# Patient Record
Sex: Female | Born: 1945 | Race: White | Hispanic: No | Marital: Married | Smoking: Former smoker
Health system: Southern US, Community
[De-identification: ages and names within clinical notes are randomized; demographics above are authoritative.]

## PROBLEM LIST (undated history)

## (undated) DIAGNOSIS — F418 Other specified anxiety disorders: Secondary | ICD-10-CM

## (undated) DIAGNOSIS — R87629 Unspecified abnormal cytological findings in specimens from vagina: Secondary | ICD-10-CM

## (undated) DIAGNOSIS — Z9889 Other specified postprocedural states: Secondary | ICD-10-CM

## (undated) DIAGNOSIS — Z85820 Personal history of malignant melanoma of skin: Secondary | ICD-10-CM

## (undated) HISTORY — PX: BASAL CELL CARCINOMA EXCISION: SHX1214

## (undated) HISTORY — DX: Unspecified abnormal cytological findings in specimens from vagina: R87.629

---

## 2002-06-28 HISTORY — PX: MELANOMA EXCISION: SHX5266

## 2002-12-27 DIAGNOSIS — C439 Malignant melanoma of skin, unspecified: Secondary | ICD-10-CM | POA: Insufficient documentation

## 2010-09-27 DIAGNOSIS — C4491 Basal cell carcinoma of skin, unspecified: Secondary | ICD-10-CM | POA: Insufficient documentation

## 2012-11-21 ENCOUNTER — Encounter: Payer: Self-pay | Admitting: *Deleted

## 2012-11-21 ENCOUNTER — Emergency Department (INDEPENDENT_AMBULATORY_CARE_PROVIDER_SITE_OTHER)
Admission: EM | Admit: 2012-11-21 | Discharge: 2012-11-21 | Disposition: A | Discharge status: Home / Self Care | Payer: No Typology Code available for payment source | Source: Home / Self Care | Attending: Family Medicine | Admitting: Family Medicine

## 2012-11-21 ENCOUNTER — Emergency Department (INDEPENDENT_AMBULATORY_CARE_PROVIDER_SITE_OTHER): Payer: No Typology Code available for payment source

## 2012-11-21 DIAGNOSIS — R1031 Right lower quadrant pain: Secondary | ICD-10-CM

## 2012-11-21 DIAGNOSIS — K449 Diaphragmatic hernia without obstruction or gangrene: Secondary | ICD-10-CM

## 2012-11-21 DIAGNOSIS — R1013 Epigastric pain: Secondary | ICD-10-CM

## 2012-11-21 DIAGNOSIS — R918 Other nonspecific abnormal finding of lung field: Secondary | ICD-10-CM

## 2012-11-21 DIAGNOSIS — I7090 Unspecified atherosclerosis: Secondary | ICD-10-CM

## 2012-11-21 DIAGNOSIS — R109 Unspecified abdominal pain: Secondary | ICD-10-CM

## 2012-11-21 HISTORY — DX: Other specified anxiety disorders: F41.8

## 2012-11-21 HISTORY — DX: Personal history of malignant melanoma of skin: Z98.890

## 2012-11-21 HISTORY — DX: Other specified postprocedural states: Z85.820

## 2012-11-21 LAB — COMPREHENSIVE METABOLIC PANEL
Albumin: 4.4 g/dL (ref 3.5–5.2)
BUN: 7 mg/dL (ref 6–23)
CO2: 27 mEq/L (ref 19–32)
Calcium: 9.2 mg/dL (ref 8.4–10.5)
Chloride: 104 mEq/L (ref 96–112)
Glucose, Bld: 90 mg/dL (ref 70–99)
Potassium: 4 mEq/L (ref 3.5–5.3)

## 2012-11-21 LAB — POCT CBC W AUTO DIFF (K'VILLE URGENT CARE)

## 2012-11-21 MED ORDER — IOHEXOL 300 MG/ML  SOLN
100.0000 mL | Freq: Once | INTRAMUSCULAR | Status: AC | PRN
  Administered 2012-11-21: 100 mL via INTRAVENOUS

## 2012-11-21 MED ORDER — PANTOPRAZOLE SODIUM 40 MG PO TBEC: 40.0000 mg | DELAYED_RELEASE_TABLET | Freq: Every day | ORAL | Status: DC

## 2012-11-21 MED ORDER — GI COCKTAIL ~~LOC~~
30.0000 mL | Freq: Once | ORAL | Status: AC
  Administered 2012-11-21: 30 mL via ORAL

## 2012-11-21 NOTE — ED Provider Notes (Addendum)
History     CSN: [id number]  Arrival date & time 11/21/12  1221   First MD Initiated Contact with Patient 11/21/12 1235      Chief Complaint  Patient presents with  . Heartburn  . Anorexia   HPI This is a 67 year old otherwise healthy female who presents today with abdominal pain, indigestion, decreased appetite. Patient states symptoms have been present over the past one and half weeks. Patient states she ate some asparagus and within 24 hours eating she developed some diarrhea and nausea. Patient states his symptoms resolved within about 2-3 days. Since that point, patient states she's had persistent indigestion symptoms regardless of her diet. Patient denies any prior history of indigestion or other GI issues. Patient denies any recent NSAID or steroid use. No known trauma. Patient denies any systemic fevers or chills. Abdominal pain has been fairly diffuse in nature without radiation. Patient states the pain seems between 4 and 6 at its worse at times. No dysuria. Diarrhea has been loose, nonbloody/nonbilious. Had normal colonoscopy 10 years ago.  Is due for another this year.   Past Medical History  Diagnosis Date  . Anxiety associated with depression   . History of melanoma excision     Past Surgical History  Procedure Laterality Date  . Melanoma excision      Family History  Problem Relation Age of Onset  . Diabetes Mother   . Heart disease Mother   . Diabetes Father   . Heart disease Father     History  Substance Use Topics  . Smoking status: Former Games developer  . Smokeless tobacco: Never Used  . Alcohol Use: Yes    OB History   Grav Para Term Preterm Abortions TAB SAB Ect Mult Living                  Review of Systems  All other systems reviewed and are negative.    Allergies  Sulfur  Home Medications   Current Outpatient Rx  Name  Route  Sig  Dispense  Refill  . buPROPion (WELLBUTRIN) 75 MG tablet   Oral   Take 75 mg by mouth 2 (two)  times daily.         . Multiple Vitamin (MULTIVITAMIN) tablet   Oral   Take 1 tablet by mouth daily.           BP 106/69  Pulse 72  Temp(Src) 98.2 F (36.8 C) (Oral)  Resp 14  Ht 5\' 3"  (1.6 m)  Wt 130 lb (58.968 kg)  BMI 23.03 kg/m2  SpO2 99%  Physical Exam  Constitutional: She appears well-developed and well-nourished.  HENT:  Head: Normocephalic and atraumatic.  Eyes: Conjunctivae are normal. Pupils are equal, round, and reactive to light.  Neck: Normal range of motion.  Cardiovascular: Normal rate and regular rhythm.   Pulmonary/Chest: Effort normal and breath sounds normal.  Abdominal: Soft. Bowel sounds are normal.  + diffuse abd tenderness (mild) Moderate-severe abd pain in RLQ with mild guarding.    Musculoskeletal: Normal range of motion.  Neurological: She is alert.  Skin: Skin is warm.    ED Course  Procedures (including critical care time)  Labs Reviewed - No data to display No results found.   1. Abdominal  pain, other specified site   2. Right lower quadrant abdominal pain   3. Epigastric discomfort       MDM  Ddx remains relatively broad including gastritis/reflux, pancreatitis, peptic ulcer disease.  Some improvement in sxs  with GI cocktail.  Will start on ppi.  Will check baseline labs including CBC, CMET, lipase.  Given RLQ tenderness, will obtain CT abd pelvis with IV and oral contrast to better assess anatomy.  Will also set up GI referral given upper GI complaints.  Discussed general care and GI red flags.     The patient and/or caregiver has been counseled thoroughly with regard to treatment plan and/or medications prescribed including dosage, schedule, interactions, rationale for use, and possible side effects and they verbalize understanding. Diagnoses and expected course of recovery discussed and will return if not improved as expected or if the condition worsens. Patient and/or caregiver verbalized understanding.              Doree Albee, MD 11/21/12 1417  Ct Abdomen Pelvis W Contrast  11/21/2012   *RADIOLOGY REPORT*  Clinical Data: Anorexia.  Heartburn.  Diarrhea.  Nausea.  CT ABDOMEN AND PELVIS WITH CONTRAST  Technique:  Multidetector CT imaging of the abdomen and pelvis was performed following the standard protocol during bolus administration of intravenous contrast.  Contrast: OMNIPAQUE IOHEXOL 300 MG/ML  SOLN  Comparison: No priors.  Findings:  Lung Bases: Centrilobular emphysematous changes.  Multiple small pulmonary nodules in the lung bases bilaterally, largest of which measures 5 mm in the right lower lobe (image 4 of series 3).  Small hiatal hernia.  Abdomen/Pelvis:  The appearance of the liver, gallbladder, pancreas, spleen, bilateral adrenal glands and bilateral kidneys is unremarkable.  No significant volume of ascites.  No pneumoperitoneum.  No pathologic distension of small bowel.  No definite pathologic lymphadenopathy identified within the abdomen or pelvis on today's examination.  Atherosclerosis throughout the abdominal and pelvic vasculature, without definite aneurysm or dissection.  Uterus and bilateral ovaries are unremarkable in appearance.  Urinary bladder is nearly completely decompressed.  Musculoskeletal: There are no aggressive appearing lytic or blastic lesions noted in the visualized portions of the skeleton.  IMPRESSION: 1.  No acute findings in the abdomen or pelvis to account for the patient's symptoms. 2.  Multiple small pulmonary nodules scattered throughout the lung bases bilaterally, largest of which measures only 5 mm.  These are nonspecific, and could certainly be indicative of benign disease such as noncalcified granulomas, however, given the smoking related changes in the lungs, the patient is at high risk for bronchogenic carcinoma, and follow-up chest CT at 6-12 months is recommended. This recommendation follows the consensus statement: Guidelines for  Management of Small Pulmonary Nodules Detected on CT Scans: A Statement from the Fleischner Society as published in Radiology 2005; 237:395-400. Alternatively, if there is a history of primary malignancy or concern for metastatic disease to the chest, further evaluation with a noncontrast chest CT could be obtained at this time to establish a baseline examination. 3.  Small hiatal hernia. 4.  Atherosclerosis.   Original Report Authenticated By: Trudie Reed, M.D.    Clinical update:   Call to discuss CT scan results with patient. No acute intra-abdominal process. Noted multiple pulmonary nodules in the lung bases. Discuss this with patient. Noted prior extensive smoking history up to 10 years ago and history of stage I melanoma per patient. There was no metastasis. Patient will like to obtain a CT scan of the chest to further evaluate per the recommendations of the radiology read. We'll set this up for tomorrow morning. Patient will followup with her PCP about this issue and decide further management of this. Continue with PPI treatment for presumed reflux. Labwork including CMET  and lipase pending.   Doree Albee, MD 11/21/12 [phone]

## 2012-11-21 NOTE — ED Notes (Signed)
Ariana Mcfarland c/o diarrhea x 2 days that started 1 week ago. Since diarrhea resolved she c/o indigestion and epigastric burning with loss of appetite and fatigue.

## 2012-11-22 ENCOUNTER — Ambulatory Visit (INDEPENDENT_AMBULATORY_CARE_PROVIDER_SITE_OTHER): Payer: No Typology Code available for payment source

## 2012-11-22 ENCOUNTER — Other Ambulatory Visit: Payer: Self-pay | Admitting: Family Medicine

## 2012-11-22 DIAGNOSIS — R911 Solitary pulmonary nodule: Secondary | ICD-10-CM

## 2012-11-22 DIAGNOSIS — R918 Other nonspecific abnormal finding of lung field: Secondary | ICD-10-CM

## 2015-10-13 ENCOUNTER — Ambulatory Visit (INDEPENDENT_AMBULATORY_CARE_PROVIDER_SITE_OTHER): Payer: No Typology Code available for payment source | Admitting: Osteopathic Medicine

## 2015-10-13 ENCOUNTER — Encounter: Payer: Self-pay | Admitting: Osteopathic Medicine

## 2015-10-13 VITALS — BP 124/72 | HR 72 | Ht 63.0 in | Wt 135.0 lb

## 2015-10-13 DIAGNOSIS — Z780 Asymptomatic menopausal state: Secondary | ICD-10-CM

## 2015-10-13 DIAGNOSIS — Z87891 Personal history of nicotine dependence: Secondary | ICD-10-CM | POA: Insufficient documentation

## 2015-10-13 DIAGNOSIS — Z23 Encounter for immunization: Secondary | ICD-10-CM

## 2015-10-13 DIAGNOSIS — E785 Hyperlipidemia, unspecified: Secondary | ICD-10-CM

## 2015-10-13 DIAGNOSIS — M899 Disorder of bone, unspecified: Secondary | ICD-10-CM

## 2015-10-13 DIAGNOSIS — F32A Depression, unspecified: Secondary | ICD-10-CM | POA: Insufficient documentation

## 2015-10-13 DIAGNOSIS — M949 Disorder of cartilage, unspecified: Secondary | ICD-10-CM

## 2015-10-13 DIAGNOSIS — Z0000 Encounter for general adult medical examination without abnormal findings: Secondary | ICD-10-CM | POA: Diagnosis not present

## 2015-10-13 DIAGNOSIS — F329 Major depressive disorder, single episode, unspecified: Secondary | ICD-10-CM | POA: Insufficient documentation

## 2015-10-13 DIAGNOSIS — N952 Postmenopausal atrophic vaginitis: Secondary | ICD-10-CM | POA: Insufficient documentation

## 2015-10-13 MED ORDER — ZOSTER VACCINE LIVE 19400 UNT/0.65ML ~~LOC~~ SOLR: 0.6500 mL | Freq: Once | SUBCUTANEOUS | Status: DC

## 2015-10-13 NOTE — Progress Notes (Signed)
HPI: Ariana Mcfarland is a 70 y.o. female who presents to [facility] today for chief complaint of:  Chief Complaint  Patient presents with  . Establish Care     . No complaints today. Questions about need for mammogram and diabetes screening   Reviewed records [facility]) 08/2014 - CBC, CMP ok, ANA and RA screen neg, TC high, renal/hepatic fxn ok DEXA 12/17/14 normal Mammo 09/25/14 - scanned unable to view ?normal? Last CPE 09/2044: "Health Maintenance Status  Date Due Completion Dates  DXA SCAN 10/30/2008 10/31/2006 (Done)  Comment on 10/31/2006: normal  PNEUMOVAX 05/22/2012 ---  INFLUENZA VACCINE 02/27/2015 04/22/2014 (Done)  PAP SMEAR 07/25/2015 07/24/2012 (Done)  Comment on 07/24/2012: Normal  MAMMOGRAM 09/25/2015 ordered but not done  09/25/2014, 05/08/2013  COLONOSCOPY 09/04/2023 09/03/2013 (Done), 07/10/2003 (Done)"  Tdap 07/24/12  Past medical, social and family history reviewed: Past Medical History  Diagnosis Date  . Anxiety associated with depression   . History of melanoma excision    Past Surgical History  Procedure Laterality Date  . Melanoma excision  2004  . Basal cell carcinoma excision      x 3    Social History  Substance Use Topics  . Smoking status: Former Smoker    Quit date: 10/13/2002  . Smokeless tobacco: Never Used  . Alcohol Use: 1.2 oz/week    2 Standard drinks or equivalent per week   Family History  Problem Relation Age of Onset  . Diabetes Mother   . Heart disease Mother   . Diabetes Father   . Heart disease Father     Current Outpatient Prescriptions  Medication Sig Dispense Refill  . Ascorbic Acid (VITAMIN C PO) Take by mouth.    Marland Kitchen buPROPion (WELLBUTRIN XL) 300 MG 24 hr tablet TAKE TAKE 1 TABLET EVERY DAY  3  . VITAMIN D, ERGOCALCIFEROL, PO Take by mouth.     No current facility-administered medications for this visit.   Allergies  Allergen Reactions  . Sulfur       Review of  Systems: CONSTITUTIONAL:  No  fever, no chills, No  unintentional weight changes HEAD/EYES/EARS/NOSE/THROAT: No  headache, no vision change, no hearing change, No  sore throat, No  sinus pressure, (+) allergies CARDIAC: No  chest pain, No  pressure, No palpitations, No  orthopnea RESPIRATORY: No  cough, No  shortness of breath/wheeze GASTROINTESTINAL: No  nausea, No  vomiting, No  abdominal pain, No  blood in stool, No  diarrhea, No  constipation  MUSCULOSKELETAL: No  myalgia/arthralgia GENITOURINARY: No  incontinence, No  abnormal genital bleeding/discharge SKIN: No  rash/wounds/concerning lesions HEM/ONC: (+) easy bruising/bleeding "just due to age" per patient - not troublesome for her, No  abnormal lymph node ENDOCRINE: No polyuria/polydipsia/polyphagia, No  heat/cold intolerance  NEUROLOGIC: No  weakness, No  dizziness, No  slurred speech PSYCHIATRIC: No  concerns with depression, No  concerns with anxiety, No sleep problems  Exam:  BP 124/72 mmHg  Pulse 72  Ht 5\' 3"  (1.6 m)  Wt 135 lb (61.236 kg)  BMI 23.92 kg/m2 Constitutional: VS see above. General Appearance: alert, well-developed, well-nourished, NAD Eyes: Normal lids and conjunctive, non-icteric sclera, PERRLA Ears, Nose, Mouth, Throat: MMM, Normal external inspection ears/nares/mouth/lips/gums, TM normal bilaterally. Pharynx no erythema, no exudate.  Neck: No masses, trachea midline. No thyroid enlargement/tenderness/mass appreciated. No lymphadenopathy Respiratory: Normal respiratory effort. no wheeze, no rhonchi, no rales Cardiovascular: S1/S2 normal, no murmur, no rub/gallop auscultated. RRR.  No carotid bruit  or JVD. No abdominal aortic bruit.  Pedal pulse II/IV bilaterally DP and PT.  No lower extremity edema. Gastrointestinal: Nontender, no masses. No hepatomegaly, no splenomegaly. No hernia appreciated. Bowel sounds normal. Rectal exam deferred.  Musculoskeletal: Gait normal. No clubbing/cyanosis of digits.   Neurological: No cranial nerve deficit on limited exam. Motor and sensation intact and symmetric Skin: warm, dry, intact. No rash/ulcer. No concerning nevi or subq nodules on limited exam.   Psychiatric: Normal judgment/insight. Normal mood and affect. Oriented x3.      ASSESSMENT/PLAN: Routine screening labs as below, patient advised can do mammogram annually or every 2 years, she has number to schedule this if she would like. She is up-to-date on tetanus, bone density, colon cancer screening. Declines hepatitis C testing, Prevnar vaccine, will think about shingles vaccine. Advise return in the fall for annual flu shot. All questions answered.  Annual physical exam - Plan: CBC with Differential/Platelet, COMPLETE METABOLIC PANEL WITH GFR, TSH, VITAMIN D 25 Hydroxy (Vit-D Deficiency, Fractures), Lipid panel  Postmenopausal - Plan: VITAMIN D 25 Hydroxy (Vit-D Deficiency, Fractures)  Hyperlipidemia - Plan: TSH, Lipid panel  Bone/cartilage disorder - Plan: VITAMIN D 25 Hydroxy (Vit-D Deficiency, Fractures)  Need for shingles vaccine - Plan: zoster vaccine live, PF, (ZOSTAVAX) 28413 UNT/0.65ML injection   Return in about 1 year (around 10/12/2016), or sooner if needed, for [employer].

## 2015-10-13 NOTE — Patient Instructions (Addendum)
As long as you are doing well, let's plan to follow up annually for routine wellness/preventive care issues. When you are running low on your medications, please have your pharmacy send [place] a refill request, please allow 2-3 business days for this to be processed through our office. Any other questions or concerns, please let us know right away so we can schedule you for a visit.  Please have your blood drawn fasting (no food or drink for 8-12 hours, can take your medications as normal with water or black coffee), we will call you with the results of your labs in 2-3 business days after your blood draw, please let us know if you don't hear back from [place].   Physical anything else we can do for you, please let us know. Take care! -Dr. Loni Muse

## 2015-10-16 LAB — CBC WITH DIFFERENTIAL/PLATELET
BASOS PCT: 0 %
Basophils Absolute: 0 cells/uL (ref 0–200)
EOS PCT: 1 %
Eosinophils Absolute: 54 cells/uL (ref 15–500)
HCT: 41 % (ref 35.0–45.0)
Hemoglobin: 13.4 g/dL (ref 11.7–15.5)
LYMPHS PCT: 38 %
Lymphs Abs: 2052 cells/uL (ref 850–3900)
MCH: 29.3 pg (ref 27.0–33.0)
MCHC: 32.7 g/dL (ref 32.0–36.0)
MCV: 89.7 fL (ref 80.0–100.0)
MONOS PCT: 7 %
MPV: 10.6 fL (ref 7.5–12.5)
Monocytes Absolute: 378 cells/uL (ref 200–950)
Neutro Abs: 2916 cells/uL (ref 1500–7800)
Neutrophils Relative %: 54 %
PLATELETS: 268 10*3/uL (ref 140–400)
RBC: 4.57 MIL/uL (ref 3.80–5.10)
RDW: 13.1 % (ref 11.0–15.0)
WBC: 5.4 10*3/uL (ref 3.8–10.8)

## 2015-10-17 LAB — LIPID PANEL
CHOL/HDL RATIO: 3.5 ratio (ref <=5.0)
Cholesterol: 209 mg/dL — ABNORMAL HIGH (ref 125–200)
HDL: 60 mg/dL (ref >=46)
LDL Cholesterol: 120 mg/dL (ref <130)
Triglycerides: 147 mg/dL (ref <150)
VLDL: 29 mg/dL (ref <30)

## 2015-10-17 LAB — COMPLETE METABOLIC PANEL WITH GFR
ALT: 16 U/L (ref 6–29)
AST: 17 U/L (ref 10–35)
Albumin: 3.8 g/dL (ref 3.6–5.1)
Alkaline Phosphatase: 66 U/L (ref 33–130)
BUN: 10 mg/dL (ref 7–25)
CHLORIDE: 106 mmol/L (ref 98–110)
CO2: 25 mmol/L (ref 20–31)
CREATININE: 0.66 mg/dL (ref 0.50–0.99)
Calcium: 8.7 mg/dL (ref 8.6–10.4)
GFR, Est Non African American: >89 mL/min (ref >=60)
Glucose, Bld: 80 mg/dL (ref 65–99)
Potassium: 4.3 mmol/L (ref 3.5–5.3)
Sodium: 141 mmol/L (ref 135–146)
Total Bilirubin: 0.4 mg/dL (ref 0.2–1.2)
Total Protein: 6 g/dL — ABNORMAL LOW (ref 6.1–8.1)

## 2015-10-17 LAB — TSH: TSH: 0.88 mIU/L

## 2015-10-17 LAB — VITAMIN D 25 HYDROXY (VIT D DEFICIENCY, FRACTURES): VIT D 25 HYDROXY: 42 ng/mL (ref 30–100)

## 2015-12-21 ENCOUNTER — Emergency Department
Admission: EM | Admit: 2015-12-21 | Discharge: 2015-12-21 | Payer: No Typology Code available for payment source | Source: Home / Self Care

## 2015-12-21 NOTE — ED Notes (Signed)
Had an insect bite on right elbow about 2 weeks ago.  It itched for several days.  Used neosporin and cortisone on it.  Denies pain, rash, or illness.  Elbow is now bruised.

## 2015-12-22 ENCOUNTER — Ambulatory Visit (INDEPENDENT_AMBULATORY_CARE_PROVIDER_SITE_OTHER): Payer: No Typology Code available for payment source | Admitting: Osteopathic Medicine

## 2015-12-22 ENCOUNTER — Encounter: Payer: Self-pay | Admitting: Osteopathic Medicine

## 2015-12-22 VITALS — BP 119/76 | HR 65 | Temp 97.6°F | Wt 135.0 lb

## 2015-12-22 DIAGNOSIS — R21 Rash and other nonspecific skin eruption: Secondary | ICD-10-CM

## 2015-12-22 NOTE — Progress Notes (Signed)
HPI: Ariana Mcfarland is a 70 y.o. female who presents to [facility] today for chief complaint of:  Chief Complaint  Patient presents with  . Elbow Injury     . Context: Was recently on vacation and had bug bites on her right elbow, itched for a while but got better. Patient was reaching up recently to fix her hair and noticed dry spot on her elbows so she wanted to come in and have it looked at.  . Location: right elbow/olecranon . Quality: some dry skin, no itching or pain . Assoc signs/symptoms: No redness to the skin, no drainage, no swelling   Past medical, social and family history reviewed: Past Medical History  Diagnosis Date  . Anxiety associated with depression   . History of melanoma excision    Past Surgical History  Procedure Laterality Date  . Melanoma excision  2004  . Basal cell carcinoma excision      x 3    Social History  Substance Use Topics  . Smoking status: Former Smoker    Quit date: 10/13/2002  . Smokeless tobacco: Never Used  . Alcohol Use: 1.2 oz/week    2 Standard drinks or equivalent per week   Family History  Problem Relation Age of Onset  . Diabetes Mother   . Heart disease Mother   . Diabetes Father   . Heart disease Father     Current Outpatient Prescriptions  Medication Sig Dispense Refill  . Ascorbic Acid (VITAMIN C PO) Take by mouth.    Marland Kitchen buPROPion (WELLBUTRIN XL) 300 MG 24 hr tablet TAKE TAKE 1 TABLET EVERY DAY  3  . VITAMIN D, ERGOCALCIFEROL, PO Take by mouth.     No current facility-administered medications for this visit.   Allergies  Allergen Reactions  . Sulfur       Review of Systems: CONSTITUTIONAL:  No  fever, no chills, No recent illness SKIN: (+) rash/wounds/concerning lesions HEM/ONC: No  abnormal lymph node NEUROLOGIC: No  weakness  Exam:  BP 119/76 mmHg  Pulse 65  Temp(Src) 97.6 F (36.4 C) (Oral)  Wt 135 lb (61.236 kg)  SpO2 98% Constitutional: VS see above. General  Appearance: alert, well-developed, well-nourished, NAD Muscle skeletal: Normal elbow range of motion, nontender, no effusion Skin: Normal elbow range of motion and skin exam on right elbow with exception of very small, less than 0.5 cm, small slight scale c/w healing insect bite. No subcutaneous lesions of concern, no ulceration or drainage, skin is warm, dry, intact. No rash/ulcer. No other concerning nevi or subq nodules on limited exam.  No ecchymosis Psychiatric: Normal judgment/insight. Normal mood and affect. Oriented x3.      ASSESSMENT/PLAN:  Rash and nonspecific skin eruption - essentially resolved, area of concern appears healing normal, try moisturizing lotion and mild exfoliation with washcloth and mild soap   All questions werE answered.  ER/RTC precautions were reviewed with the patient. Return if symptoms worsen or fail to improve, and as directed for routine care.

## 2016-01-13 ENCOUNTER — Other Ambulatory Visit: Payer: Self-pay | Admitting: Osteopathic Medicine

## 2016-01-13 DIAGNOSIS — Z1231 Encounter for screening mammogram for malignant neoplasm of breast: Secondary | ICD-10-CM

## 2016-01-20 ENCOUNTER — Ambulatory Visit (INDEPENDENT_AMBULATORY_CARE_PROVIDER_SITE_OTHER): Payer: No Typology Code available for payment source | Admitting: Family Medicine

## 2016-01-20 ENCOUNTER — Ambulatory Visit (INDEPENDENT_AMBULATORY_CARE_PROVIDER_SITE_OTHER): Payer: No Typology Code available for payment source

## 2016-01-20 VITALS — BP 130/68

## 2016-01-20 DIAGNOSIS — W57XXXA Bitten or stung by nonvenomous insect and other nonvenomous arthropods, initial encounter: Secondary | ICD-10-CM | POA: Diagnosis not present

## 2016-01-20 DIAGNOSIS — L819 Disorder of pigmentation, unspecified: Secondary | ICD-10-CM | POA: Insufficient documentation

## 2016-01-20 DIAGNOSIS — T148 Other injury of unspecified body region: Secondary | ICD-10-CM

## 2016-01-20 DIAGNOSIS — M25521 Pain in right elbow: Secondary | ICD-10-CM

## 2016-01-20 NOTE — Patient Instructions (Signed)
Thank you for coming in today. Get labs and xray today.  Return in a month if not better or sooner if needed.

## 2016-01-20 NOTE — Progress Notes (Signed)
       Ariana Mcfarland is a 70 y.o. female who presents to [facility] today for right elbow hyperpigmentation.  Patient was seen in late June for a tiny bump on her right elbow she thought was due to bug bites. The red bump has improved however she notes a large patch of skin hyperpigmentation along her elbow. She's not sure if it resolved and then recurred or if it's been there the entire time. It does not hurt or bother her in any way. She denies any injury fevers or chills. She denies any radiating pain weakness or numbness.  She is very worried about Lyme disease. Her daughter developed Lyme disease and was unaware that she been bitten by a tick.   Past Medical History:  Diagnosis Date  . Anxiety associated with depression   . History of melanoma excision    Past Surgical History:  Procedure Laterality Date  . BASAL CELL CARCINOMA EXCISION     x 3   . MELANOMA EXCISION  2004   Social History  Substance Use Topics  . Smoking status: Former Smoker    Quit date: 10/13/2002  . Smokeless tobacco: Never Used  . Alcohol use 1.2 oz/week    2 Standard drinks or equivalent per week   family history includes Diabetes in her father and mother; Heart disease in her father and mother.  ROS as above:  Medications: Current Outpatient Prescriptions  Medication Sig Dispense Refill  . Ascorbic Acid (VITAMIN C PO) Take by mouth.    Marland Kitchen buPROPion (WELLBUTRIN XL) 300 MG 24 hr tablet TAKE TAKE 1 TABLET EVERY DAY  3  . VITAMIN D, ERGOCALCIFEROL, PO Take by mouth.     No current facility-administered medications for this visit.    Allergies  Allergen Reactions  . Sulfur      Exam:  BP 130/68  Gen: Well NAD HEENT: EOMI,  MMM Lungs: Normal work of breathing. CTABL Heart: RRR no MRG Abd: NABS, Soft. Nondistended, Nontender Exts: Brisk capillary refill, warm and well perfused.    Right elbow: No significant swelling or erythema. Large macular patch of mild skin hyperpigmentation. Nontender normal motion.      No results found for this or any previous visit (from the past 24 hour(s)). No results found.    Assessment and Plan: 70 y.o. female with right elbow skin hyperpigmentation following presumed arthropod bite. Plan for limited workup is watchful waiting has not helped much. Plan for x-ray CBC CMP PT/INR and Lyme disease titer. Return if not better.  Discussed warning signs or symptoms. Please see discharge instructions. Patient expresses understanding.  CC: Emeterio Reeve, DO

## 2016-01-21 LAB — CBC
HCT: 39.7 % (ref 35.0–45.0)
HEMOGLOBIN: 13.1 g/dL (ref 11.7–15.5)
MCH: 29.6 pg (ref 27.0–33.0)
MCHC: 33 g/dL (ref 32.0–36.0)
MCV: 89.8 fL (ref 80.0–100.0)
MPV: 10.5 fL (ref 7.5–12.5)
PLATELETS: 293 10*3/uL (ref 140–400)
RBC: 4.42 MIL/uL (ref 3.80–5.10)
RDW: 13.1 % (ref 11.0–15.0)
WBC: 6 10*3/uL (ref 3.8–10.8)

## 2016-01-21 LAB — COMPREHENSIVE METABOLIC PANEL
ALBUMIN: 4 g/dL (ref 3.6–5.1)
ALT: 15 U/L (ref 6–29)
AST: 17 U/L (ref 10–35)
Alkaline Phosphatase: 70 U/L (ref 33–130)
BUN: 13 mg/dL (ref 7–25)
CHLORIDE: 108 mmol/L (ref 98–110)
CO2: 25 mmol/L (ref 20–31)
CREATININE: 0.73 mg/dL (ref 0.50–0.99)
Calcium: 8.7 mg/dL (ref 8.6–10.4)
Glucose, Bld: 100 mg/dL — ABNORMAL HIGH (ref 65–99)
POTASSIUM: 4.3 mmol/L (ref 3.5–5.3)
SODIUM: 144 mmol/L (ref 135–146)
Total Bilirubin: 0.2 mg/dL (ref 0.2–1.2)
Total Protein: 6 g/dL — ABNORMAL LOW (ref 6.1–8.1)

## 2016-01-21 LAB — PROTIME-INR
INR: 1
PROTHROMBIN TIME: 10.2 s (ref 9.0–11.5)

## 2016-01-21 LAB — LYME AB/WESTERN BLOT REFLEX: B burgdorferi Ab IgG+IgM: <0.9 Index (ref <0.90)

## 2016-01-22 ENCOUNTER — Telehealth: Payer: Self-pay | Admitting: Family Medicine

## 2016-01-22 NOTE — Telephone Encounter (Signed)
Pt stated the place on her arm/elbow is not getting any better and it is concerning to her because she dont know what it is from and if it is a skin problem or internal. She stated since the labs are normal what other options does she have. She wants to know if she should come back in and see you or would she need to go see a dermatologist.

## 2016-01-23 NOTE — Telephone Encounter (Signed)
Left detailed vm with recommedations. Requested a call back with what pt would like to do.

## 2016-01-23 NOTE — Telephone Encounter (Signed)
Recommend giving it some time. I don't think this represents any dangerous process however if Ariana Mcfarland is very concerned I'm happy to place a referral to a dermatologist. Please let me know what eb the patient would like to do

## 2016-03-03 ENCOUNTER — Ambulatory Visit (INDEPENDENT_AMBULATORY_CARE_PROVIDER_SITE_OTHER): Payer: No Typology Code available for payment source

## 2016-03-03 DIAGNOSIS — Z1231 Encounter for screening mammogram for malignant neoplasm of breast: Secondary | ICD-10-CM

## 2016-03-03 DIAGNOSIS — R928 Other abnormal and inconclusive findings on diagnostic imaging of breast: Secondary | ICD-10-CM | POA: Diagnosis not present

## 2016-03-09 ENCOUNTER — Other Ambulatory Visit: Payer: Self-pay | Admitting: Osteopathic Medicine

## 2016-03-09 DIAGNOSIS — R928 Other abnormal and inconclusive findings on diagnostic imaging of breast: Secondary | ICD-10-CM

## 2016-03-15 ENCOUNTER — Ambulatory Visit
Admission: RE | Admit: 2016-03-15 | Discharge: 2016-03-15 | Disposition: A | Discharge status: Home / Self Care | Payer: No Typology Code available for payment source | Source: Ambulatory Visit | Attending: Osteopathic Medicine | Admitting: Osteopathic Medicine

## 2016-03-15 DIAGNOSIS — R928 Other abnormal and inconclusive findings on diagnostic imaging of breast: Secondary | ICD-10-CM

## 2016-07-13 ENCOUNTER — Encounter: Payer: Self-pay | Admitting: Osteopathic Medicine

## 2016-07-13 ENCOUNTER — Ambulatory Visit (INDEPENDENT_AMBULATORY_CARE_PROVIDER_SITE_OTHER): Payer: No Typology Code available for payment source | Admitting: Osteopathic Medicine

## 2016-07-13 ENCOUNTER — Ambulatory Visit (INDEPENDENT_AMBULATORY_CARE_PROVIDER_SITE_OTHER): Payer: No Typology Code available for payment source

## 2016-07-13 VITALS — BP 125/67 | HR 72 | Wt 136.0 lb

## 2016-07-13 DIAGNOSIS — Z23 Encounter for immunization: Secondary | ICD-10-CM | POA: Diagnosis not present

## 2016-07-13 DIAGNOSIS — M25511 Pain in right shoulder: Secondary | ICD-10-CM | POA: Diagnosis not present

## 2016-07-13 DIAGNOSIS — T148XXA Other injury of unspecified body region, initial encounter: Secondary | ICD-10-CM

## 2016-07-13 DIAGNOSIS — M79601 Pain in right arm: Secondary | ICD-10-CM | POA: Diagnosis not present

## 2016-07-13 DIAGNOSIS — R079 Chest pain, unspecified: Secondary | ICD-10-CM | POA: Diagnosis not present

## 2016-07-13 LAB — CBC WITH DIFFERENTIAL/PLATELET
Basophils Absolute: 0 cells/uL (ref 0–200)
Basophils Relative: 0 %
EOS ABS: 62 {cells}/uL (ref 15–500)
Eosinophils Relative: 1 %
HEMATOCRIT: 41.1 % (ref 35.0–45.0)
HEMOGLOBIN: 13.4 g/dL (ref 11.7–15.5)
LYMPHS ABS: 1922 {cells}/uL (ref 850–3900)
Lymphocytes Relative: 31 %
MCH: 29.6 pg (ref 27.0–33.0)
MCHC: 32.6 g/dL (ref 32.0–36.0)
MCV: 90.9 fL (ref 80.0–100.0)
MPV: 9.9 fL (ref 7.5–12.5)
Monocytes Absolute: 558 cells/uL (ref 200–950)
Monocytes Relative: 9 %
NEUTROS ABS: 3658 {cells}/uL (ref 1500–7800)
NEUTROS PCT: 59 %
Platelets: 312 10*3/uL (ref 140–400)
RBC: 4.52 MIL/uL (ref 3.80–5.10)
RDW: 13 % (ref 11.0–15.0)
WBC: 6.2 10*3/uL (ref 3.8–10.8)

## 2016-07-13 LAB — COMPLETE METABOLIC PANEL WITH GFR
ALBUMIN: 4.2 g/dL (ref 3.6–5.1)
ALK PHOS: 67 U/L (ref 33–130)
ALT: 20 U/L (ref 6–29)
AST: 16 U/L (ref 10–35)
BILIRUBIN TOTAL: 0.3 mg/dL (ref 0.2–1.2)
BUN: 11 mg/dL (ref 7–25)
CALCIUM: 9.5 mg/dL (ref 8.6–10.4)
CO2: 29 mmol/L (ref 20–31)
Chloride: 105 mmol/L (ref 98–110)
Creat: 0.68 mg/dL (ref 0.50–0.99)
Glucose, Bld: 94 mg/dL (ref 65–99)
POTASSIUM: 4.3 mmol/L (ref 3.5–5.3)
Sodium: 141 mmol/L (ref 135–146)
TOTAL PROTEIN: 6.6 g/dL (ref 6.1–8.1)

## 2016-07-13 LAB — TSH: TSH: 0.93 m[IU]/L

## 2016-07-13 NOTE — Patient Instructions (Addendum)
Plan:  Labs today and Xrays here  Let's schedule further imaging of the arm based on labs - we may need to do an ultrasound or a CT scan  If you develop chest pain or trouble breathing, please seek care ASAP  If pain persists, may need to seek second opinion   Can try Tylenol as needed for pain/discomfort: Acetaminophen/Tylenol 500 mg every 4-6 hours

## 2016-07-14 LAB — CP4508-PT/INR AND PTT
APTT: 28 s (ref 22–34)
INR: 0.9
PROTHROMBIN TIME: 9.8 s (ref 9.0–11.5)

## 2016-07-16 ENCOUNTER — Telehealth: Payer: Self-pay | Admitting: Osteopathic Medicine

## 2016-07-16 NOTE — Telephone Encounter (Signed)
Please call patient: I wanted to make sure to follow-up with her about her arm/shoulder - plan to schedule follow-up with me or with Dr Georgina Snell if desires sports medicine/ortho second opinion.

## 2016-07-16 NOTE — Progress Notes (Signed)
HPI: Ariana Mcfarland is a 71 y.o. female  who presents to [place] today, 07/16/16,  for chief complaint of:  Chief Complaint  Patient presents with  . Bleeding/Bruising    RIGHT ARM     . Context: no injury, (+)hx hyperpigmentation to elbow and hand . Location: R arm/shoulder, area of pec insertion  . Quality: soreness, hyperpigmentation/bruising which was initially clear in the center with some swelling, see previous notes form this summer - similar skin complaints  . Duration: months, pain a bit worse over last few weeks . Assoc signs/symptoms: no swelling in upper extremity, no loss of strength, no joint pain.     Past medical, surgical, social and family history reviewed: Patient Active Problem List   Diagnosis Date Noted  . 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 Atrophic vaginitis 10/13/2015  . Clinical depression 10/13/2015  . History of tobacco use 10/13/2015  . Basal cell carcinoma 09/27/2010  . Malignant melanoma ([place]) 12/27/2002   Past Surgical History:  Procedure Laterality Date  . BASAL CELL CARCINOMA EXCISION     x 3   . MELANOMA EXCISION  2004   Social History  Substance Use Topics  . Smoking status: Former Smoker    Quit date: 10/13/2002  . Smokeless tobacco: Never Used  . Alcohol use 1.2 oz/week    2 Standard drinks or equivalent per week   Family History  Problem Relation Age of Onset  . Diabetes Mother   . Heart disease Mother   . Diabetes Father   . Heart disease Father      Current medication list and allergy/intolerance information reviewed:   Current Outpatient Prescriptions on File Prior to Visit  Medication Sig Dispense Refill  . Ascorbic Acid (VITAMIN C PO) Take by mouth.    Marland Kitchen buPROPion (WELLBUTRIN XL) 300 MG 24 hr tablet TAKE TAKE 1 TABLET EVERY DAY  3  . VITAMIN D, ERGOCALCIFEROL, PO Take by mouth.     No current facility-administered medications on file prior to visit.    Allergies  Allergen  Reactions  . Sulfur       Review of Systems:  Constitutional: No recent illness  Cardiac: No  chest pain  Respiratory:  No  shortness of breath. No  Cough  Gastrointestinal: No  abdominal pain,  Musculoskeletal: +myalgia/arthralgia  Skin: +Rash  Hem/Onc: +easy bruising, no gum/rectal bleeding, No  abnormal lumps/bumps at this time  Neurologic: No  weakness, No  Dizziness   Exam:  BP 125/67   Pulse 72   Wt 136 lb (61.7 kg)   BMI 24.09 kg/m   Constitutional: VS see above. General Appearance: alert, well-developed, well-nourished, NAD  Eyes: Normal lids and conjunctive, non-icteric sclera  Ears, Nose, Mouth, Throat: MMM, Normal external inspection ears/nares/mouth/lips/gums.  Neck: No masses, trachea midline.   Respiratory: Normal respiratory effort. no wheeze, no rhonchi, no rales  Cardiovascular: S1/S2 normal, no murmur, no rub/gallop auscultated. RRR.   Musculoskeletal: Gait normal. Symmetric and independent movement of all extremities. Neg apprehension test shoulder, neg cross-arm test, neg Yergason's, neg drop-arm.   Neurological: Normal balance/coordination. No tremor. No deficit strength/sesation R to L arms   Skin: warm, dry, intact.   Psychiatric: Normal judgment/insight. Normal mood and affect. Oriented x3.   Recent Results (from the past 2160 hour(s))  CBC with Differential/Platelet     Status: None   Collection Time: 07/13/16  9:59 AM  Result Value Ref Range   WBC 6.2 3.8 - 10.8 K/uL  RBC 4.52 3.80 - 5.10 MIL/uL   Hemoglobin 13.4 11.7 - 15.5 g/dL   HCT 41.1 35.0 - 45.0 %   MCV 90.9 80.0 - 100.0 fL   MCH 29.6 27.0 - 33.0 pg   MCHC 32.6 32.0 - 36.0 g/dL   RDW 13.0 11.0 - 15.0 %   Platelets 312 140 - 400 K/uL   MPV 9.9 7.5 - 12.5 fL   Neutro Abs 3,658 1,500 - 7,800 cells/uL   Lymphs Abs 1,922 850 - 3,900 cells/uL   Monocytes Absolute 558 200 - 950 cells/uL   Eosinophils Absolute 62 15 - 500 cells/uL   Basophils Absolute 0 0 - 200 cells/uL    Neutrophils Relative % 59 %   Lymphocytes Relative 31 %   Monocytes Relative 9 %   Eosinophils Relative 1 %   Basophils Relative 0 %   Smear Review Criteria for review not met   COMPLETE METABOLIC PANEL WITH GFR     Status: None   Collection Time: 07/13/16  9:59 AM  Result Value Ref Range   Sodium 141 135 - 146 mmol/L   Potassium 4.3 3.5 - 5.3 mmol/L   Chloride 105 98 - 110 mmol/L   CO2 29 20 - 31 mmol/L   Glucose, Bld 94 65 - 99 mg/dL   BUN 11 7 - 25 mg/dL   Creat 0.68 0.50 - 0.99 mg/dL    Comment:   For patients > or = 70 years of age: The upper reference limit for Creatinine is approximately 13% higher for people identified as African-American.      Total Bilirubin 0.3 0.2 - 1.2 mg/dL   Alkaline Phosphatase 67 33 - 130 U/L   AST 16 10 - 35 U/L   ALT 20 6 - 29 U/L   Total Protein 6.6 6.1 - 8.1 g/dL   Albumin 4.2 3.6 - 5.1 g/dL   Calcium 9.5 8.6 - 10.4 mg/dL   GFR, Est African American >89 >=60 mL/min   GFR, Est Non African American >89 >=60 mL/min  TSH     Status: None   Collection Time: 07/13/16  9:59 AM  Result Value Ref Range   TSH 0.93 mIU/L    Comment:   Reference Range   > or = 20 Years  0.40-4.50   Pregnancy Range First trimester  0.26-2.66 Second trimester 0.55-2.73 Third trimester  0.43-2.91     CP4508-PT/INR AND PTT     Status: None   Collection Time: 07/13/16  9:59 AM  Result Value Ref Range   Prothrombin Time 9.8 9.0 - 11.5 sec    Comment:   For more information on this test, go to: http://education.[facility].com/faq/FAQ104      INR 0.9     Comment:   Reference Range                        0.9-1.1 Moderate-intensity Warfarin Therapy    2.0-3.0 Higher-intensity Warfarin Therapy      3.0-4.0      aPTT 28 22 - 34 sec    Comment:   This test has not been validated for monitoring unfractionated heparin therapy. For testing that is validated for this type of therapy, please refer to the Heparin Anti-Xa assay (test code [phone]).   For  additional information, please refer to http://education.[facility].com/faq/FAQ159 (This link is being provided for informational/educational purposes only.)       Dg Chest 2 View  Result Date: 07/13/2016 CLINICAL DATA:  Chest and  right shoulder pain for 1 week EXAM: CHEST  2 VIEW COMPARISON:  CT chest of 11/22/2012 FINDINGS: The lungs are slightly hyperaerated with mildly increased AP diameter suggesting emphysematous change. No pneumonia or effusion is seen. Mediastinal and hilar contours are unremarkable. The heart is within normal limits in size. No bony abnormality is seen. IMPRESSION: No active cardiopulmonary disease. Slight hyper aeration may indicate emphysema. Electronically Signed   By: Ivar Drape M.D.   On: 07/13/2016 12:23   Dg Shoulder Right  Result Date: 07/13/2016 CLINICAL DATA:  Shoulder pain.  Bruising. EXAM: RIGHT SHOULDER - 2+ VIEW COMPARISON:  No recent prior. FINDINGS: No acute bony or joint abnormality identified. No evidence of fracture or dislocation. Degenerative changes right shoulder. IMPRESSION: No acute or focal abnormality. Degenerative changes right shoulder . Electronically Signed   By: Marcello Moores  Register   On: 07/13/2016 12:22     ASSESSMENT/PLAN:   Ddx includes thoracic outlet syndrome variant, other MSK arm pain, no swelling or anything to lead to dx DVT. Not sure what to make of hyperpigmentation - not really c/w ecchymoses or embolic. Basic lab w/u as above, consider second opinion from sports medicine .  Arm pain, right - Plan: CBC with Differential/Platelet, COMPLETE METABOLIC PANEL WITH GFR, TSH, CP4508-PT/INR AND PTT, DG Shoulder Right, DG Chest 2 View  Bruising - Plan: CBC with Differential/Platelet, CP4508-PT/INR AND PTT  Need for prophylactic vaccination and inoculation against influenza - Plan: Flu Vaccine QUAD 36+ mos IM    Patient Instructions  Plan:  Labs today and Xrays here  Let's schedule further imaging of the arm based on  labs - we may need to do an ultrasound or a CT scan  If you develop chest pain or trouble breathing, please seek care ASAP  If pain persists, may need to seek second opinion   Can try Tylenol as needed for pain/discomfort: Acetaminophen/Tylenol 500 mg every 4-6 hours       Visit summary with medication list and pertinent instructions was printed for patient to review. All questions at time of visit were answered - patient instructed to contact office with any additional concerns. ER/RTC precautions were reviewed with the patient. Follow-up plan: Return in about 1 week (around 07/20/2016) for recheck arm .

## 2016-07-16 NOTE — Telephone Encounter (Signed)
Spoke to patient gave her labs as noted below. Rusell Meneely,CMA

## 2016-09-17 ENCOUNTER — Telehealth: Payer: Self-pay

## 2016-09-17 NOTE — Telephone Encounter (Signed)
Pt is requesting a refill of Bupropion, she has an appointment with you next Wednesday.  Please advise.

## 2016-09-22 ENCOUNTER — Ambulatory Visit (INDEPENDENT_AMBULATORY_CARE_PROVIDER_SITE_OTHER): Payer: No Typology Code available for payment source | Admitting: Osteopathic Medicine

## 2016-09-22 ENCOUNTER — Encounter: Payer: Self-pay | Admitting: Osteopathic Medicine

## 2016-09-22 VITALS — BP 121/55 | HR 73 | Ht 63.0 in | Wt 133.0 lb

## 2016-09-22 DIAGNOSIS — Z0000 Encounter for general adult medical examination without abnormal findings: Secondary | ICD-10-CM

## 2016-09-22 DIAGNOSIS — F3342 Major depressive disorder, recurrent, in full remission: Secondary | ICD-10-CM

## 2016-09-22 MED ORDER — BUPROPION HCL ER (XL) 300 MG PO TB24: 300.0000 mg | ORAL_TABLET | Freq: Every day | ORAL | 3 refills | Status: DC

## 2016-09-22 NOTE — Progress Notes (Signed)
HPI: Ariana Mcfarland is a 71 y.o. female  who presents to [facility] today, 09/22/16,  for chief complaint of:  Chief Complaint  Patient presents with  . Medication Refill    Wellbutrin for anx/dep    Here for refill on Wellbutrin. Doing well on this medication. Has been on this for several years. See below for PHQ9     Past medical history, surgical history, social history and family history reviewed.  Patient Active Problem List   Diagnosis Date Noted  . 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 Atrophic vaginitis 10/13/2015  . Clinical depression 10/13/2015  . History of tobacco use 10/13/2015  . Basal cell carcinoma 09/27/2010  . Malignant melanoma ([place]) 12/27/2002    Current medication list and allergy/intolerance information reviewed.   Current Outpatient Prescriptions on File Prior to Visit  Medication Sig Dispense Refill  . Ascorbic Acid (VITAMIN C PO) Take by mouth.    Marland Kitchen buPROPion (WELLBUTRIN XL) 300 MG 24 hr tablet TAKE TAKE 1 TABLET EVERY DAY  3  . VITAMIN D, ERGOCALCIFEROL, PO Take by mouth.     No current facility-administered medications on file prior to visit.    Allergies  Allergen Reactions  . Sulfur       Review of Systems:  Constitutional: No recent illness, feels well today   Cardiac: No  chest pain, No  pressure, No palpitations  Respiratory:  No  shortness of breath.   Skin: No  Rash   Exam:  BP (!) 121/55   Pulse 73   Ht 5\' 3"  (1.6 m)   Wt 133 lb (60.3 kg)   SpO2 98%   BMI 23.56 kg/m   Constitutional: VS see above. General Appearance: alert, well-developed, well-nourished, NAD  Eyes: Normal lids and conjunctive, non-icteric sclera  Ears, Nose, Mouth, Throat: MMM, Normal external inspection ears/nares/mouth/lips/gums.  Neck: No masses, trachea midline.   Respiratory: Normal respiratory effort. no wheeze, no rhonchi, no rales  Cardiovascular: S1/S2 normal, no murmur, no rub/gallop auscultated.  RRR.   Musculoskeletal: Gait normal. Symmetric and independent movement of all extremities  Neurological: Normal balance/coordination. No tremor.  Skin: warm, dry, intact.   Psychiatric: Normal judgment/insight. Normal mood and affect. Oriented x3.    Depression screen [facility] 2/9 09/22/2016  Decreased Interest 0  Down, Depressed, Hopeless 0  PHQ - 2 Score 0  Altered sleeping 0  Tired, decreased energy 0  Change in appetite 0  Feeling bad or failure about yourself  0  Trouble concentrating 0  Moving slowly or fidgety/restless 0  Suicidal thoughts 0  PHQ-9 Score 0      ASSESSMENT/PLAN:   Recurrent major depressive disorder, in full remission ([place])  Annual physical exam - Plan: CBC with Differential/Platelet, COMPLETE METABOLIC PANEL WITH GFR, Lipid panel, TSH, VITAMIN D 25 Hydroxy (Vit-D Deficiency, Fractures)      Follow-up plan: Return in about 2 months (around 11/22/2016) for ANNUAL PHYSICAL, get labs prior to visit, see me sooner if needed .  Visit summary with medication list and pertinent instructions was printed for patient to review, alert [place] if any changes needed. All questions at time of visit were answered - patient instructed to contact office with any additional concerns. ER/RTC precautions were reviewed with the patient and understanding verbalized.   Note: Labs ordered today for future annual physical visit, preventive care visit was not performed or billed today

## 2016-11-10 LAB — COMPLETE METABOLIC PANEL WITH GFR
ALT: 17 U/L (ref 6–29)
AST: 14 U/L (ref 10–35)
Albumin: 4 g/dL (ref 3.6–5.1)
Alkaline Phosphatase: 66 U/L (ref 33–130)
BILIRUBIN TOTAL: 0.3 mg/dL (ref 0.2–1.2)
BUN: 10 mg/dL (ref 7–25)
CO2: 24 mmol/L (ref 20–31)
CREATININE: 0.73 mg/dL (ref 0.50–0.99)
Calcium: 8.8 mg/dL (ref 8.6–10.4)
Chloride: 107 mmol/L (ref 98–110)
GFR, Est Non African American: 84 mL/min (ref >=60)
GLUCOSE: 87 mg/dL (ref 65–99)
Potassium: 4.2 mmol/L (ref 3.5–5.3)
SODIUM: 141 mmol/L (ref 135–146)
TOTAL PROTEIN: 6.1 g/dL (ref 6.1–8.1)

## 2016-11-10 LAB — CBC WITH DIFFERENTIAL/PLATELET
Basophils Absolute: 52 cells/uL (ref 0–200)
Basophils Relative: 1 %
EOS ABS: 260 {cells}/uL (ref 15–500)
Eosinophils Relative: 5 %
HCT: 40.6 % (ref 35.0–45.0)
Hemoglobin: 13.5 g/dL (ref 11.7–15.5)
Lymphocytes Relative: 27 %
Lymphs Abs: 1404 cells/uL (ref 850–3900)
MCH: 30 pg (ref 27.0–33.0)
MCHC: 33.3 g/dL (ref 32.0–36.0)
MCV: 90.2 fL (ref 80.0–100.0)
MONOS PCT: 10 %
MPV: 9.9 fL (ref 7.5–12.5)
Monocytes Absolute: 520 cells/uL (ref 200–950)
Neutro Abs: 2964 cells/uL (ref 1500–7800)
Neutrophils Relative %: 57 %
PLATELETS: 286 10*3/uL (ref 140–400)
RBC: 4.5 MIL/uL (ref 3.80–5.10)
RDW: 13 % (ref 11.0–15.0)
WBC: 5.2 10*3/uL (ref 3.8–10.8)

## 2016-11-10 LAB — LIPID PANEL
CHOL/HDL RATIO: 4.1 ratio (ref <5.0)
CHOLESTEROL: 240 mg/dL — AB (ref <200)
HDL: 59 mg/dL (ref >50)
LDL Cholesterol: 152 mg/dL — ABNORMAL HIGH (ref <100)
Triglycerides: 145 mg/dL (ref <150)
VLDL: 29 mg/dL (ref <30)

## 2016-11-10 LAB — TSH: TSH: 1.3 m[IU]/L

## 2016-11-11 LAB — VITAMIN D 25 HYDROXY (VIT D DEFICIENCY, FRACTURES): VIT D 25 HYDROXY: 52 ng/mL (ref 30–100)

## 2016-11-18 ENCOUNTER — Ambulatory Visit (INDEPENDENT_AMBULATORY_CARE_PROVIDER_SITE_OTHER): Payer: No Typology Code available for payment source | Admitting: Osteopathic Medicine

## 2016-11-18 ENCOUNTER — Encounter: Payer: Self-pay | Admitting: Osteopathic Medicine

## 2016-11-18 VITALS — BP 113/63 | HR 78 | Ht 63.0 in | Wt 132.0 lb

## 2016-11-18 DIAGNOSIS — K5900 Constipation, unspecified: Secondary | ICD-10-CM | POA: Insufficient documentation

## 2016-11-18 DIAGNOSIS — Z7189 Other specified counseling: Secondary | ICD-10-CM | POA: Insufficient documentation

## 2016-11-18 DIAGNOSIS — Z0000 Encounter for general adult medical examination without abnormal findings: Secondary | ICD-10-CM | POA: Diagnosis not present

## 2016-11-18 MED ORDER — ZOSTER VAC RECOMB ADJUVANTED 50 MCG/0.5ML IM SUSR: 0.5000 mL | Freq: Once | INTRAMUSCULAR | 1 refills | Status: AC

## 2016-11-18 NOTE — Patient Instructions (Signed)
Patient education: Constipation in adults (Beyond the Basics)  Author: Bettey Mare, MD Section Editor: Carman Ching, MD Deputy Editor: Penelope Coop, MD, MPH, AGAF Contributor Disclosures All topics are updated as new evidence becomes available and our peer review process is complete.  Literature review current through: Feb 2017.  This topic last updated: Jan 17, 2012.   CONSTIPATION OVERVIEW - Constipation refers to a change in bowel habits, but it has varied meanings. Stools may be too hard or too small, difficult to pass, or infrequent (less than three times per week). People with constipation may also notice a frequent need to strain and a sense that the bowels are not empty.  Constipation is a very common problem. Each year more than 2.5 million Americans visit their healthcare provider for relief from this problem. Many factors can contribute to or cause constipation, although in most people, no single cause can be found. In general, constipation occurs more frequently as you get older. (See "Etiology and evaluation of chronic constipation in adults".)  CONSTIPATION DIAGNOSIS - Constipation can usually be diagnosed based upon your symptoms and a physical examination. You should also mention any medications you take regularly since some medications can cause constipation.  You may need a rectal examination as part of a physical examination. A rectal examination involves inserting a gloved finger inside the rectum to feel for any lumps or abnormalities. This test can also check for blood in the stool.  Further testing may be ordered in some situations, for example, if you have had a recent change in bowel habits, blood in the stool, weight loss, or a family history of colon cancer. Testing may include blood tests, x-rays, sigmoidoscopy, colonoscopy, or more specialized testing if needed. (See "Patient education: Flexible sigmoidoscopy (Beyond the Basics)" and "Patient education: Colonoscopy  (Beyond the Basics)".)  When to seek help - Most people can treat constipation at home, without seeing a healthcare provider. However, you should speak with a healthcare provider if the problem: ?Is new (ie, represents a change in your normal pattern) ?Lasts longer than three weeks ?Is severe ?Is associated with any other concerning features such as blood on the toilet paper, weight loss, fevers, or weakness   CONSTIPATION TREATMENT - Treatment for constipation includes changing some behaviors, eating foods high in fiber, and using laxatives or enemas if needed.  You can try these treatments at home, before seeing a healthcare provider. However, if you do not have a bowel movement within a few days, you should call your healthcare provider for further assistance.   Behavior changes - The bowels are most active following meals, and this is often the time when stools will pass most readily. If you ignore your body's signals to have a bowel movement, the signals become weaker and weaker over time. By paying close attention to these signals, you may have an easier time moving your bowels. Drinking a caffeine-containing beverage in the morning may also be helpful.  Increase fiber - Increasing fiber in your diet may reduce or eliminate constipation. The recommended amount of dietary fiber is 20 to 35 grams of fiber per day. By reading the product information panel on the side of the package, you can determine the number of grams of fiber per serving Many fruits and vegetables can be particularly helpful in preventing and treating constipation. This is especially true of citrus fruits, prunes, and prune juice. Some breakfast cereals are also an excellent source of dietary fiber.  Fiber side effects - Consuming  large amounts of fiber can cause abdominal bloating or gas; this can be minimized by starting with a small amount and slowly increasing until stools become softer and more frequent.   LAXATIVES -  If behavior changes and increasing fiber does not relieve your constipation, you may try taking a laxative. A variety of laxatives are available for treating constipation. The choice between them is based upon how they work, how safe the treatment is, and your healthcare provider's preferences. In general, laxatives can be categorized into the following groups:  Bulk forming laxatives - These include natural fiber and commercial fiber preparations such as: ?Psyllium (Konsyl; Metamucil; Perdiem) ?Methylcellulose (Citrucel) ?Calcium polycarbophil (FiberCon; Fiber-Lax; Mitrolan) ?Wheat dextrin (Benefiber) You should increase the dose of fiber supplements slowly to prevent gas and cramping, and you should always take the supplement with plenty of fluid.  Hyperosmolar laxatives - Hyperosmolar laxatives include: ?Polyethylene glycol (MiraLax, Glycolax) ?Lactulose ?Sorbitol Polyethylene glycol is generally preferred since it does not cause gas or bloating and is available in the [place] without a prescription. Lactulose and sorbitol can produce gas and bloating. Sorbitol works as well as lactulose and is much less expensive.  Saline laxatives - Saline laxatives such as magnesium hydroxide (Milk of Magnesia) and magnesium citrate (Evac-Q-Mag) act similarly to the hyperosmolar laxatives.  Stimulant laxatives - Stimulant laxatives include senna (eg, Black Draught, Ex-lax, Fletcher's, [place], Senokot) and bisacodyl (eg, Correctol, Doxidan, Dulcolax). Some people overuse stimulant laxatives. Taking stimulant laxatives regularly or in large amounts can cause side effects, including low potassium levels. Thus, you should take these drugs carefully if you must use them regularly. However, there is no convincing evidence that using stimulant laxatives regularly damages the colon, and they do not increase the risk for colorectal cancer or other tumors.  New treatments - Lubiprostone (Amitiza) is a  prescription medication that treats severe constipation. It is expensive, but it may be recommended if you do not respond to other treatments. Linaclotide (Linzess) is another prescription medication that has been approved for the treatment of chronic constipation. It is also an expensive medication as compared with other agents with the exception of lubiprostone. Linaclotide may be recommended if you do not respond to other treatments.  Pills, suppositories, or enemas? - Laxatives are available as pills that you take by mouth or as suppositories or enemas that you insert into the rectum. In general, suppositories and enemas work more quickly compared to pills, but many people do not like using them.  Healthcare providers occasionally recommend prepackaged enema kits containing sodium phosphate/biphosphate (Fleet) if you have not responded to other treatments. These are not recommended if you have problems with your heart or kidneys, and should not be used more than once unless directed by your healthcare provider.  Constipation treatments to avoid ?Emollients - Emollient laxatives, principally mineral oil, soften stools by moisturizing them. However, other treatments have fewer risks and equal benefit. ?Natural products - A wide variety of natural products are advertised for constipation. Some of them contain the active ingredients found in commercially available laxatives. However, their dose and purity may not be carefully controlled. Thus, these products are not generally recommended.  A variety of home-made enema preparations have been used throughout the years, such as soapsuds, hydrogen peroxide, and household detergents. These can be extremely irritating to the lining of the intestine and should be avoided.   BIOFEEDBACK FOR CONSTIPATION - Biofeedback is a behavioral approach that may help some people with severe chronic constipation who involuntarily squeeze (  rather than relax) their muscles  while having a bowel movement.   WHERE TO GET MORE INFORMATION - Your healthcare provider is the best source of information for questions and concerns related to your medical problem. This article will be updated as needed on our website ([email or web address]). Related topics for patients, as well as selected articles written for healthcare professionals, are also available. Some of the most relevant are listed below.

## 2016-11-18 NOTE — Progress Notes (Signed)
HPI: Ariana Mcfarland is a 71 y.o. female  who presents to [facility] today, 11/18/16,  for chief complaint of:  Chief Complaint  Patient presents with  . Annual Exam    Patient here for annual physical / wellness exam.  See preventive care reviewed as below.  Recent labs reviewed in detail with the patient.   Additional concerns today include:  Constipation - chronic, worse since not exercising, OTC laxative and added fiber   Past medical, surgical, social and family history reviewed: Patient Active Problem List   Diagnosis Date Noted  . 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 Atrophic vaginitis 10/13/2015  . Clinical depression 10/13/2015  . History of tobacco use 10/13/2015  . Basal cell carcinoma 09/27/2010  . Malignant melanoma ([place]) 12/27/2002   Past Surgical History:  Procedure Laterality Date  . BASAL CELL CARCINOMA EXCISION     x 3   . MELANOMA EXCISION  2004   Social History  Substance Use Topics  . Smoking status: Former Smoker    Quit date: 10/13/2002  . Smokeless tobacco: Never Used  . Alcohol use 1.2 oz/week    2 Standard drinks or equivalent per week   Family History  Problem Relation Age of Onset  . Diabetes Mother   . Heart disease Mother   . Diabetes Father   . Heart disease Father      Current medication list and allergy/intolerance information reviewed:   Current Outpatient Prescriptions  Medication Sig Dispense Refill  . Ascorbic Acid (VITAMIN C PO) Take by mouth.    Marland Kitchen buPROPion (WELLBUTRIN XL) 300 MG 24 hr tablet Take 1 tablet (300 mg total) by mouth daily. 90 tablet 3  . CALCIUM PO Take by mouth daily.    Marland Kitchen RESVERATROL PO Take by mouth daily.    Marland Kitchen VITAMIN D, ERGOCALCIFEROL, PO Take by mouth.     No current facility-administered medications for this visit.    Allergies  Allergen Reactions  . Sulfur       Review of Systems:  Constitutional:  No recent illness, No unintentional weight changes. No  significant fatigue.   HEENT: No  headache, no vision change,   Cardiac: No  chest pain, No  pressure, No palpitations  Respiratory:  No  shortness of breath. No  Cough  Gastrointestinal: No  abdominal pain, No  nausea, No  vomiting,  No  blood in stool   Musculoskeletal: No new myalgia/arthralgia  Genitourinary: No  incontinence, No  abnormal genital bleeding, No abnormal genital discharge  Skin: No  Rash, No other wounds/concerning lesions  Endocrine: No cold intolerance,  No heat intolerance.  Neurologic: No  weakness, No  dizziness,   Psychiatric: No  concerns with depression, No  concerns with anxiety, No sleep problems, No mood problems  Exam:  BP 113/63   Pulse 78   Ht _0  (1.6 m)   Wt 132 lb (59.9 kg)   BMI 23.38 kg/m   Constitutional: VS see above. General Appearance: alert, well-developed, well-nourished, NAD  Eyes: Normal lids and conjunctive, non-icteric sclera  Ears, Nose, Mouth, Throat: MMM, Normal external inspection ears/nares/mouth/lips/gums.   Neck: No masses, trachea midline. No thyroid enlargement. No tenderness/mass appreciated. No lymphadenopathy  Respiratory: Normal respiratory effort. no wheeze, no rhonchi, no rales  Cardiovascular: S1/S2 normal, no murmur, no rub/gallop auscultated. RRR. No lower extremity edema.   Gastrointestinal: Nontender, no masses. No hepatomegaly, no splenomegaly. No hernia appreciated. Bowel sounds normal. Rectal exam deferred.  Musculoskeletal: Gait normal. No clubbing/cyanosis of digits.   Neurological: Normal balance/coordination. No tremor. No cranial nerve deficit on limited exam. .   Skin: warm, dry, intact. No rash/ulcer. No concerning nevi or subq nodules on limited exam.    Psychiatric: Normal judgment/insight. Normal mood and affect. Oriented x3.   Recent Results (from the past 2160 hour(s))  CBC with Differential/Platelet     Status: None   Collection Time: 11/10/16  8:37 AM  Result Value Ref Range    WBC 5.2 3.8 - 10.8 K/uL   RBC 4.50 3.80 - 5.10 MIL/uL   Hemoglobin 13.5 11.7 - 15.5 g/dL   HCT 40.6 35.0 - 45.0 %   MCV 90.2 80.0 - 100.0 fL   MCH 30.0 27.0 - 33.0 pg   MCHC 33.3 32.0 - 36.0 g/dL   RDW 13.0 11.0 - 15.0 %   Platelets 286 140 - 400 K/uL   MPV 9.9 7.5 - 12.5 fL   Neutro Abs 2,964 1,500 - 7,800 cells/uL   Lymphs Abs 1,404 850 - 3,900 cells/uL   Monocytes Absolute 520 200 - 950 cells/uL   Eosinophils Absolute 260 15 - 500 cells/uL   Basophils Absolute 52 0 - 200 cells/uL   Neutrophils Relative % 57 %   Lymphocytes Relative 27 %   Monocytes Relative 10 %   Eosinophils Relative 5 %   Basophils Relative 1 %   Smear Review Criteria for review not met   COMPLETE METABOLIC PANEL WITH GFR     Status: None   Collection Time: 11/10/16  8:37 AM  Result Value Ref Range   Sodium 141 135 - 146 mmol/L   Potassium 4.2 3.5 - 5.3 mmol/L   Chloride 107 98 - 110 mmol/L   CO2 24 20 - 31 mmol/L   Glucose, Bld 87 65 - 99 mg/dL   BUN 10 7 - 25 mg/dL   Creat 0.73 0.50 - 0.99 mg/dL    Comment:   For patients > or = 70 years of age: The upper reference limit for Creatinine is approximately 13% higher for people identified as African-American.      Total Bilirubin 0.3 0.2 - 1.2 mg/dL   Alkaline Phosphatase 66 33 - 130 U/L   AST 14 10 - 35 U/L   ALT 17 6 - 29 U/L   Total Protein 6.1 6.1 - 8.1 g/dL   Albumin 4.0 3.6 - 5.1 g/dL   Calcium 8.8 8.6 - 10.4 mg/dL   GFR, Est African American >89 >=60 mL/min   GFR, Est Non African American 84 >=60 mL/min  Lipid panel     Status: Abnormal   Collection Time: 11/10/16  8:37 AM  Result Value Ref Range   Cholesterol 240 (H) <200 mg/dL   Triglycerides 145 <150 mg/dL   HDL 59 >50 mg/dL   Total CHOL/HDL Ratio 4.1 <5.0 Ratio   VLDL 29 <30 mg/dL   LDL Cholesterol 152 (H) <100 mg/dL  TSH     Status: None   Collection Time: 11/10/16  8:37 AM  Result Value Ref Range   TSH 1.30 mIU/L    Comment:   Reference Range   > or = 20 Years   0.40-4.50   Pregnancy Range First trimester  0.26-2.66 Second trimester 0.55-2.73 Third trimester  0.43-2.91     VITAMIN D 25 Hydroxy (Vit-D Deficiency, Fractures)     Status: None   Collection Time: 11/10/16  8:37 AM  Result Value Ref Range   Vit D, 25-Hydroxy  52 30 - 100 ng/mL    Comment: Vitamin D Status           25-OH Vitamin D        Deficiency                <20 ng/mL        Insufficiency         20 - 29 ng/mL        Optimal             > or = 30 ng/mL   For 25-OH Vitamin D testing on patients on D2-supplementation and patients for whom quantitation of D2 and D3 fractions is required, the QuestAssureD 25-OH VIT D, (D2,D3), LC/MS/MS is recommended: order code [id number] (patients > 2 yrs).      ASSESSMENT/PLAN:   Annual physical exam - labs reviewed, caridac risk assessed andin shared decision making will not start statin at this time  - Plan: Zoster Vac Recomb Adjuvanted (SHINGRIX) injection, DG BONE DENSITY (DXA)  Constipation, unspecified constipation type - lifestyle mods and OTC meds discussed, consider Rx if no better, possible IBS  Advance directive discussed with patient - given form, let me know if any questions   FEMALE PREVENTIVE CARE Updated 11/18/16   ANNUAL SCREENING/COUNSELING  Diet/Exercise - HEALTHY HABITS DISCUSSED TO DECREASE CV RISK History  Smoking Status  . Former Smoker  . Quit date: 10/13/2002  Smokeless Tobacco  . Never Used   History  Alcohol Use  . 1.2 oz/week  . 2 Standard drinks or equivalent per week   Depression screen [facility] 2/9 09/22/2016  Decreased Interest 0  Down, Depressed, Hopeless 0  PHQ - 2 Score 0  Altered sleeping 0  Tired, decreased energy 0  Change in appetite 0  Feeling bad or failure about yourself  0  Trouble concentrating 0  Moving slowly or fidgety/restless 0  Suicidal thoughts 0  PHQ-9 Score 0    Domestic violence concerns - no  HTN SCREENING - SEE [place]  Sexually active in the past  year - Yes with female.  Need/want STI testing today? - no  Concerns about libido or pain with sex? - no  Plans for pregnancy? - n/a  INFECTIOUS DISEASE SCREENING  HIV - does not need  GC/CT - does not need  HepC - DOB [birth year] - does not need  TB - does not need  DISEASE SCREENING  Lipid - does not need  DM2 - does not need  Osteoporosis - women age 35+ - needs next few mos   CANCER SCREENING  Cervical - does not need - normal screening prior to age 29  Breast - does not need - had mammo in the fall   Lung - does not need  Colon - does not need  ADULT VACCINATION  Influenza - annual vaccine recommended  Td - booster every 10 years   Zoster - option at 28, yes at 60+   PCV13 - already has but we need records  PPSV23 - already has but we need records Immunization History  Administered Date(s) Administered  . Influenza,inj,Quad PF,36+ Mos 07/13/2016   OTHER  Fall - exercise and Vit D age 66+ - does not need  Consider ASA - age 55-59 - does not need       Visit summary with medication list and pertinent instructions was printed for patient to review. All questions at time of visit were answered - patient instructed to contact office  with any additional concerns. ER/RTC precautions were reviewed with the patient. Follow-up plan: Return in about 1 year (around 11/18/2017) for [place], sooner if needed.

## 2017-02-02 ENCOUNTER — Other Ambulatory Visit: Payer: Self-pay | Admitting: Osteopathic Medicine

## 2017-02-02 DIAGNOSIS — Z1231 Encounter for screening mammogram for malignant neoplasm of breast: Secondary | ICD-10-CM

## 2017-03-29 ENCOUNTER — Other Ambulatory Visit: Payer: Self-pay | Admitting: Osteopathic Medicine

## 2017-03-29 DIAGNOSIS — Z1239 Encounter for other screening for malignant neoplasm of breast: Secondary | ICD-10-CM

## 2017-04-11 ENCOUNTER — Ambulatory Visit: Payer: No Typology Code available for payment source

## 2017-05-04 ENCOUNTER — Ambulatory Visit (INDEPENDENT_AMBULATORY_CARE_PROVIDER_SITE_OTHER): Payer: No Typology Code available for payment source

## 2017-05-04 DIAGNOSIS — Z1239 Encounter for other screening for malignant neoplasm of breast: Secondary | ICD-10-CM

## 2017-05-04 DIAGNOSIS — Z1231 Encounter for screening mammogram for malignant neoplasm of breast: Secondary | ICD-10-CM

## 2017-05-04 DIAGNOSIS — Z13820 Encounter for screening for osteoporosis: Secondary | ICD-10-CM | POA: Diagnosis not present

## 2017-07-18 ENCOUNTER — Ambulatory Visit (INDEPENDENT_AMBULATORY_CARE_PROVIDER_SITE_OTHER): Payer: No Typology Code available for payment source

## 2017-07-18 ENCOUNTER — Encounter: Payer: Self-pay | Admitting: Osteopathic Medicine

## 2017-07-18 ENCOUNTER — Ambulatory Visit (INDEPENDENT_AMBULATORY_CARE_PROVIDER_SITE_OTHER): Payer: No Typology Code available for payment source | Admitting: Osteopathic Medicine

## 2017-07-18 VITALS — BP 126/77 | HR 82 | Temp 97.7°F | Wt 134.4 lb

## 2017-07-18 DIAGNOSIS — M25552 Pain in left hip: Secondary | ICD-10-CM | POA: Diagnosis not present

## 2017-07-18 DIAGNOSIS — M79605 Pain in left leg: Secondary | ICD-10-CM

## 2017-07-18 DIAGNOSIS — M79652 Pain in left thigh: Secondary | ICD-10-CM | POA: Diagnosis not present

## 2017-07-18 MED ORDER — MELOXICAM 7.5 MG PO TABS: 7.5000 mg | ORAL_TABLET | Freq: Every day | ORAL | 0 refills | Status: DC

## 2017-07-18 NOTE — Progress Notes (Signed)
HPI: Ariana Mcfarland is a 72 y.o. female who  has a past medical history of Anxiety associated with depression and History of melanoma excision.  she presents to [facility] today, 07/18/17,  for chief complaint of: Leg pain  Fall on a cruise ship last March 2018. Since then, on and off has had pain in the left side of her leg and left hip. No numbness/tingling, no significant lower back pain.     Past medical, surgical, social and family history reviewed:  Patient Active Problem List   Diagnosis Date Noted  . Advance directive discussed with patient 11/18/2016  . Constipation 11/18/2016  . Annual physical exam 11/18/2016  . 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 Atrophic vaginitis 10/13/2015  . Clinical depression 10/13/2015  . History of tobacco use 10/13/2015  . Basal cell carcinoma 09/27/2010  . Malignant melanoma ([place]) 12/27/2002    Past Surgical History:  Procedure Laterality Date  . BASAL CELL CARCINOMA EXCISION     x 3   . MELANOMA EXCISION  2004    Social History   Tobacco Use  . Smoking status: Former Smoker    Last attempt to quit: 10/13/2002    Years since quitting: 14.7  . Smokeless tobacco: Never Used  Substance Use Topics  . Alcohol use: Yes    Alcohol/week: 1.2 oz    Types: 2 Standard drinks or equivalent per week    Family History  Problem Relation Age of Onset  . Diabetes Mother   . Heart disease Mother   . Diabetes Father   . Heart disease Father      Current medication list and allergy/intolerance information reviewed:    Current Outpatient Medications  Medication Sig Dispense Refill  . Ascorbic Acid (VITAMIN C PO) Take by mouth.    Marland Kitchen buPROPion (WELLBUTRIN XL) 300 MG 24 hr tablet Take 1 tablet (300 mg total) by mouth daily. 90 tablet 3  . CALCIUM PO Take by mouth daily.    Marland Kitchen RESVERATROL PO Take by mouth daily.    Marland Kitchen VITAMIN D, ERGOCALCIFEROL, PO Take by mouth.     No current facility-administered  medications for this visit.     Allergies  Allergen Reactions  . Sulfur   . Sulfa Antibiotics     Headache      Review of Systems:  Constitutional:  No  fever, no chills  HEENT: No  headache  Cardiac: No  chest pain  Respiratory:  No  shortness of breath.   Gastrointestinal: No  abdominal pain, No  nausea  Musculoskeletal: +myalgia/arthralgia  Skin: No  Rash  Neurologic: No  weakness, No  dizziness   Exam:  BP 126/77   Pulse 82   Temp 97.7 F (36.5 C) (Oral)   Wt 134 lb 6.4 oz (61 kg)   BMI 23.81 kg/m   Constitutional: VS see above. General Appearance: alert, well-developed, well-nourished, NAD  Eyes: Normal lids and conjunctive, non-icteric sclera  Ears, Nose, Mouth, Throat: MMM, Normal external inspection ears/nares/mouth/lips/gums.    Neck: No masses, trachea midline.   Respiratory: Normal respiratory effort.  Cardiovascular: No lower extremity edema.   Gastrointestinal: Nontender, no masses.   Musculoskeletal: Gait normal. Tenderness at left trochanteric bursa and down maybe proximal one third of eye TB, some tenderness at left SI joint. Normal logrolling, negative straight leg raise  Neurological: Normal balance/coordination. No tremor. Motor and sensation intact and symmetric.   Skin: warm, dry, intact. No rash/ulcer.   Dg Sacrum/coccyx  Result Date: 07/18/2017 CLINICAL DATA:  Left hip and thigh pain since a fall 1 year ago. EXAM: SACRUM AND COCCYX - 2+ VIEW COMPARISON:  AP view of the pelvis of today's date FINDINGS: The sacrum is subjectively adequately mineralized. At least 3 intact sacral struts are visible. The SI joints are grossly normal. On the lateral view the contour of the sacrum exhibits a fairly abrupt angulation at the junction of the fourth and fifth segments. This may reflect an old fracture. The presacral soft tissues are normal however. IMPRESSION: Possible old lower sacral fracture.  No acute bony abnormality. Electronically  Signed   By: David  Martinique M.D.   On: 07/18/2017 16:38   Dg Hip Unilat W Or W/o Pelvis 2-3 Views Left  Result Date: 07/18/2017 CLINICAL DATA:  Status post fall 1 year ago with persistent left hip and leg pain. EXAM: DG HIP (WITH OR WITHOUT PELVIS) 2-3V LEFT COMPARISON:  Left hip series of today's date FINDINGS: The bony pelvis is subjectively adequately mineralized. There is no lytic nor blastic lesion. The observed portions of the sacrum exhibit no acute abnormalities. AP and lateral views of the left hip reveal no fracture or dislocation. The joint space is well maintained. IMPRESSION: There is no acute or significant chronic bony abnormality of the left hip. Electronically Signed   By: David  Martinique M.D.   On: 07/18/2017 16:34   Dg Femur Min 2 Views Left  Result Date: 07/18/2017 CLINICAL DATA:  One year history of left hip and leg pain since a fall. EXAM: LEFT FEMUR 2 VIEWS COMPARISON:  Left hip series of today's date FINDINGS: The femur is subjectively adequately mineralized. There is degenerative narrowing of the medial and lateral joint spaces of the knee and there beaking of the tibial spines. The soft tissues of the thigh are unremarkable. The hip joint is reported separately. IMPRESSION: There is no acute or significant chronic bony abnormality of the left femur. There are mild degenerative changes of the left knee. Electronically Signed   By: David  Martinique M.D.   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 meloxicam (MOBIC) 7.5 MG tablet    Sig: Take 1 tablet (7.5 mg total) by mouth daily. As needed for pain    Dispense:  30 tablet    Refill:  0     Visit summary with medication list and pertinent instructions was printed for patient to review. All questions at time of visit were answered - patient instructed to contact office with any additional concerns. ER/RTC precautions were reviewed with the patient.   Follow-up plan: Return for Recheck with sports medicine if no improvement with anti-inflammatories and home exercises.  Note: Total time spent 25 minutes, greater than 50% of the visit was spent face-to-face counseling and coordinating care for the following: The primary encounter diagnosis was Left hip pain.  A diagnosis of Left leg pain was also pertinent to this visit.Marland Kitchen  Please note: voice recognition software was used to produce this document, and typos may escape review. Please contact Dr. Sheppard Coil for any needed clarifications.

## 2017-08-08 ENCOUNTER — Telehealth: Payer: Self-pay

## 2017-08-08 NOTE — Telephone Encounter (Signed)
Copied from [place]. Topic: Appointment Scheduling - Prior Auth Required for Appointment >> Aug 08, 2017 11:46 AM Robina Ade, Helene Kelp D wrote: No appointment has been scheduled. Patient is requesting Dr. Charlett Blake or Dr. Lorelei Pont for a NP appointment for her and her husband. Per scheduling protocol, this appointment requires a prior authorization prior to scheduling. Please call patient back.  Route to department's PEC pool.

## 2017-08-08 NOTE — Telephone Encounter (Signed)
No new patients presently

## 2017-08-08 NOTE — Telephone Encounter (Signed)
Pt requesting to establish with Dr. Lorelei Pont or Dr. Charlett Blake. Please advise.

## 2017-08-09 NOTE — Telephone Encounter (Signed)
Dr. Lorelei Pont has agreed to see Pt.

## 2017-08-11 NOTE — Telephone Encounter (Signed)
NP appt scheduled 12/05/2017 w/ Dr. Lorelei Pont

## 2017-10-14 ENCOUNTER — Other Ambulatory Visit: Payer: Self-pay | Admitting: Osteopathic Medicine

## 2017-10-22 ENCOUNTER — Other Ambulatory Visit: Payer: Self-pay | Admitting: Osteopathic Medicine

## 2017-10-24 MED ORDER — BUPROPION HCL ER (XL) 300 MG PO TB24: 300.0000 mg | ORAL_TABLET | Freq: Every day | ORAL | 0 refills | Status: DC

## 2017-10-24 NOTE — Addendum Note (Signed)
Addended by: Dessie Coma on: 10/24/2017 09:33 AM   Modules accepted: Orders

## 2017-12-05 ENCOUNTER — Ambulatory Visit: Payer: No Typology Code available for payment source | Admitting: Family Medicine

## 2017-12-08 ENCOUNTER — Ambulatory Visit: Payer: No Typology Code available for payment source | Admitting: Family Medicine

## 2017-12-08 ENCOUNTER — Encounter: Payer: Self-pay | Admitting: Family Medicine

## 2017-12-08 VITALS — BP 104/64 | HR 74 | Temp 98.3°F | Ht 61.5 in | Wt 128.4 lb

## 2017-12-08 DIAGNOSIS — N644 Mastodynia: Secondary | ICD-10-CM | POA: Diagnosis not present

## 2017-12-08 DIAGNOSIS — M79652 Pain in left thigh: Secondary | ICD-10-CM

## 2017-12-08 NOTE — Progress Notes (Signed)
Chief Complaint  Patient presents with  . New Patient (Initial Visit)       New Patient Visit SUBJECTIVE: HPI: Ariana Mcfarland is an 72 y.o.female who is being seen for establishing care.  The patient was previously seen at [facility].  Pt fell on a cruise ship 18 mo ago. Fell and hit her L thigh, has been having issues ever since including pain and a "divot" over the area. Previous PCP got L hip XR that showed degen changes, but no breaks over femur. She is able to walk OK, but has issues when turning over or stretching area of interested. No numbness, tingling, balance issues, bruising, redness or drainage.  2-3 weeks ago had some L breast pain that lasted for 1 d. Her last mammogram was in 11/18 and was neg. No lumps/bumps, nipple discharge, or injury. No recent change in bras.  Recent dx'd with melanoma of LUE, concerned this could be related. No other personal hx of breast cancer.   Allergies  Allergen Reactions  . Sulfur   . Sulfa Antibiotics     Headache    Past Medical History:  Diagnosis Date  . Anxiety associated with depression   . History of melanoma excision    Past Surgical History:  Procedure Laterality Date  . BASAL CELL CARCINOMA EXCISION     x 3   . MELANOMA EXCISION  2004   Family History  Problem Relation Age of Onset  . Diabetes Mother   . Heart disease Mother   . Diabetes Father   . Heart disease Father    Allergies  Allergen Reactions  . Sulfur   . Sulfa Antibiotics     Headache    Current Outpatient Medications:  .  Ascorbic Acid (VITAMIN C PO), Take 1,000 mg by mouth daily. , Disp: , Rfl:  .  buPROPion (WELLBUTRIN XL) 300 MG 24 hr tablet, Take 1 tablet (300 mg total) by mouth daily., Disp: 90 tablet, Rfl: 0 .  CALCIUM PO, Take by mouth daily., Disp: , Rfl:  .  RESVERATROL PO, Take by mouth daily., Disp: , Rfl:  .  VITAMIN D, ERGOCALCIFEROL, PO, Take 1,000 Units by mouth daily. , Disp: , Rfl:    ROS MSK: +thigh pain  Neuro: Denies  numbness/tingling   OBJECTIVE: BP 104/64 (BP Location: Left Arm, Patient Position: Sitting, Cuff Size: Normal)   Pulse 74   Temp 98.3 F (36.8 C) (Oral)   Ht 5' 1.5" (1.562 m)   Wt 128 lb 6 oz (58.2 kg)   SpO2 97%   BMI 23.86 kg/m   Constitutional: -  VS reviewed -  Well developed, well nourished, appears stated age -  No apparent distress  Psychiatric: -  Oriented to person, place, and time -  Memory intact -  Affect and mood normal -  Fluent conversation, good eye contact -  Judgment and insight age appropriate  ENMT: -  MMM    Pharynx moist, no exudate, no erythema  Cardiovascular: -  RRR -  No LE edema  Respiratory: -  Normal respiratory effort, no accessory muscle use, no retraction -  Breath sounds equal, no wheezes, no ronchi, no crackles  Neurological:  -  CN II - XII grossly intact -  Sensation grossly intact to light touch, equal bilaterally  Musculoskeletal: -  No clubbing, no cyanosis -  Gait normal -  TTP over L lateral thigh; there is an inward deformity noted -  5/5 strength throughout LLE -  +  ttp over greater troch on L -  Neg Ober's, Stinchfield, FABER, FADDIR  Skin: -  No significant lesion on inspection -  Warm and dry to palpation   ASSESSMENT/PLAN: Left thigh pain - Plan: Ambulatory referral to Sports Medicine  Breast pain, left  Ice, heat, Tylenol, stretch, refer to sports med for possible [place] and further management. Given nml mammogram, will await results of biopsy. If very sinister or if new issues arise, will obtain uni diag mammogram and [place].  Patient should return at earliest convenience for CPE. The patient voiced understanding and agreement to the plan.   [place], DO 12/08/17  11:46 AM

## 2017-12-08 NOTE — Progress Notes (Signed)
Pre visit review using our clinic review tool, if applicable. No additional management support is needed unless otherwise documented below in the visit note. 

## 2017-12-08 NOTE — Patient Instructions (Addendum)
Ice/cold pack over area for 10-15 min twice daily.  Heat (pad or rice pillow in microwave) over affected area, 10-15 minutes twice daily.   Massage the area.  OK to take Tylenol 1000 mg (2 extra strength tabs) or 975 mg (3 regular strength tabs) every 6 hours as needed.  If you do not hear anything about your referral in the next 1-2 weeks, call our office and ask for an update.  We will be appraised with the results of your skin. If it shows anything sinister, we will follow up with imaging for the breast.  Let us know if you need anything.

## 2017-12-19 ENCOUNTER — Ambulatory Visit (INDEPENDENT_AMBULATORY_CARE_PROVIDER_SITE_OTHER): Payer: No Typology Code available for payment source | Admitting: Family Medicine

## 2017-12-19 ENCOUNTER — Encounter: Payer: Self-pay | Admitting: Family Medicine

## 2017-12-19 VITALS — BP 115/71 | HR 78 | Ht 62.0 in | Wt 128.0 lb

## 2017-12-19 DIAGNOSIS — M25552 Pain in left hip: Secondary | ICD-10-CM | POA: Diagnosis not present

## 2017-12-19 DIAGNOSIS — M7632 Iliotibial band syndrome, left leg: Secondary | ICD-10-CM | POA: Diagnosis not present

## 2017-12-19 NOTE — Patient Instructions (Signed)
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 Avoid painful activities (typically squats, lunges, stairs) when possible. Ice over area of pain 3-4 times a day for 15 minutes at a time Start physical therapy and do home exercises on days you don't go to therapy. Standing hip rotations, hip side raise exercise 3 sets of 10 once a day - add weights if this becomes too easy. Stretches - pick 2-3 and hold for 20-30 seconds x 3 - do once or twice a day. Tylenol and/or aleve as needed for pain. Follow up with me in 6 weeks.

## 2017-12-20 ENCOUNTER — Encounter: Payer: Self-pay | Admitting: Family Medicine

## 2017-12-20 NOTE — Progress Notes (Signed)
PCP and consultation requested by: Shelda Pal, DO  Subjective:   HPI: Patient is a 72 y.o. female here for left hip/thigh pain.  Patient reports about 18 months ago she was on a cruise ship when she fell hard to the left side striking lateral proximal thigh. Immediate pain that she thought would resolve with time. Pain, however, has persisted at 2-3/10 level, worse with lying on this side, trying to stretch the area, sharp. Was given some home exercises to do. Some radiation down leg laterally. No back pain. No numbness, bowel/bladder dysfunction.  Past Medical History:  Diagnosis Date  . Anxiety associated with depression   . History of melanoma excision     Current Outpatient Medications on File Prior to Visit  Medication Sig Dispense Refill  . Ascorbic Acid (VITAMIN C PO) Take 1,000 mg by mouth daily.     Marland Kitchen buPROPion (WELLBUTRIN XL) 300 MG 24 hr tablet Take 1 tablet (300 mg total) by mouth daily. 90 tablet 0  . CALCIUM PO Take by mouth daily.    Marland Kitchen RESVERATROL PO Take by mouth daily.     No current facility-administered medications on file prior to visit.     Past Surgical History:  Procedure Laterality Date  . BASAL CELL CARCINOMA EXCISION     x 3   . MELANOMA EXCISION  2004    Allergies  Allergen Reactions  . Sulfur   . Sulfa Antibiotics     Headache    Social History   Socioeconomic History  . Marital status: Married    Spouse name: Not on file  . Number of children: Not on file  . Years of education: Not on file  . Highest education level: Not on file  Occupational History  . Not on file  Social Needs  . Financial resource strain: Not on file  . Food insecurity:    Worry: Not on file    Inability: Not on file  . Transportation needs:    Medical: Not on file    Non-medical: Not on file  Tobacco Use  . Smoking status: Former Smoker    Last attempt to quit: 10/13/2002    Years since quitting: 15.1  . Smokeless tobacco: Never Used   Substance and Sexual Activity  . Alcohol use: Yes    Alcohol/week: 1.2 oz    Types: 2 Standard drinks or equivalent per week  . Drug use: No  . Sexual activity: Never  Lifestyle  . Physical activity:    Days per week: Not on file    Minutes per session: Not on file  . Stress: Not on file  Relationships  . Social connections:    Talks on phone: Not on file    Gets together: Not on file    Attends religious service: Not on file    Active member of club or organization: Not on file    Attends meetings of clubs or organizations: Not on file    Relationship status: Not on file  . Intimate partner violence:    Fear of current or ex partner: Not on file    Emotionally abused: Not on file    Physically abused: Not on file    Forced sexual activity: Not on file  Other Topics Concern  . Not on file  Social History Narrative  . Not on file    Family History  Problem Relation Age of Onset  . Diabetes Mother   . Heart disease Mother   .  Diabetes Father   . Heart disease Father     BP 115/71   Pulse 78   Ht 5\' 2"  (1.575 m)   Wt 128 lb (58.1 kg)   BMI 23.41 kg/m   Review of Systems: See HPI above.     Objective:  Physical Exam:  Gen: NAD, comfortable in exam room  Back: No gross deformity, scoliosis. No TTP.  No midline or bony TTP. FROM without pain. Strength LEs 5/5 all muscle groups except 3/5 left hip abduction.   2+ MSRs in patellar and achilles tendons, equal bilaterally. Negative SLRs. Sensation intact to light touch bilaterally.  Left hip: Indentation laterally mid-proximal IT band.  No other deformity, swelling, bruising. FROM with 5/5 strength except 3/5 with hip abduction. TTP proximal-mid IT band.  No other tenderness. NVI distally. Negative logroll bilateral hips Very tight fabers with lateral pain on left, negative right.  Negative piriformis.  Assessment & Plan:  1. 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  We  discussed hers is a more traumatic type with likely partial tear but not treated differently.  Location is more diffuse than bursa and primary pain is more distal than this - discussed injection less likely to be beneficial to her.  She will start physical therapy and do home exercises.  Icing, tylenol and/or aleve as needed.  F/u in 6 weeks.

## 2017-12-20 NOTE — Assessment & Plan Note (Addendum)
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  We discussed hers is a more traumatic type with likely partial tear but not treated differently.  Location is more diffuse than bursa and primary pain is more distal than this - discussed injection less likely to be beneficial to her.  She will start physical therapy and do home exercises.  Icing, tylenol and/or aleve as needed.  F/u in 6 weeks.

## 2017-12-27 ENCOUNTER — Ambulatory Visit: Payer: No Typology Code available for payment source | Admitting: Physical Therapy

## 2018-01-02 ENCOUNTER — Encounter: Payer: Self-pay | Admitting: Family Medicine

## 2018-01-02 ENCOUNTER — Ambulatory Visit: Payer: No Typology Code available for payment source | Admitting: Family Medicine

## 2018-01-02 VITALS — BP 114/69 | HR 87 | Temp 98.6°F | Resp 16 | Ht 63.0 in | Wt 128.8 lb

## 2018-01-02 DIAGNOSIS — H0011 Chalazion right upper eyelid: Secondary | ICD-10-CM | POA: Insufficient documentation

## 2018-01-02 NOTE — Patient Instructions (Addendum)
This has probably been here longer than 2 days. Take some warm compresses and massage downwards. Twice daily for 10 or so minute should help.  Chalazion A chalazion is a swelling or lump on the eyelid. It can affect the upper or lower eyelid. What are the causes? This condition may be caused by:  Long-lasting (chronic) inflammation of the eyelid glands.  A blocked oil gland in the eyelid.  What are the signs or symptoms? Symptoms of this condition include:  A swelling on the eyelid. The swelling may spread to areas around the eye.  A hard lump on the eyelid. This lump may make it hard to see out of the eye.  How is this diagnosed? This condition is diagnosed with an examination of the eye. How is this treated? This condition is treated by applying a warm compress to the eyelid. If the condition does not improve after two days, it may be treated with:  Surgery.  Medicine that is injected into the chalazion by a health care provider.  Medicine that is applied to the eye.  Follow these instructions at home:  Do not touch the chalazion.  Do not try to remove the pus, such as by squeezing the chalazion or sticking it with a pin or needle.  Do not rub your eyes.  Wash your hands often. Dry your hands with a clean towel.  Keep your face, scalp, and eyebrows clean.  Avoid wearing eye makeup.  Apply a warm, moist compress to the eyelid 4-6 times a day for 10-15 minutes at a time. This will help to open any blocked glands and help to reduce redness and swelling.  Apply over-the-counter and prescription medicines only as told by your health care provider.  If the chalazion does not break open (rupture) on its own in a month, return to your health care provider.  Keep all follow-up appointments as told by your health care provider. This is important. Contact a health care provider if:  Your eyelid has not improved in 4 weeks.  Your eyelid is getting worse.  You have a  fever.  The chalazion does not rupture on its own with home treatment in a month. Get help right away if:  You have pain in your eye.  Your vision changes.  The chalazion becomes painful or red  The chalazion gets bigger. This information is not intended to replace advice given to you by your health care provider. Make sure you discuss any questions you have with your health care provider. Document Released: 06/11/2000 Document Revised: 11/20/2015 Document Reviewed: 10/07/2014 Elsevier Interactive Patient Education  [employer].

## 2018-01-02 NOTE — Progress Notes (Signed)
CC: Eye complaint  Ariana Mcfarland is here for right eyelid nodule.  Duration: 2 days, unsure if it had been there longer. Noticed a lump on her R upper eyelid Denies: Pain, vision changes, drainage, redness, fevers Treatment to date: none  ROS:  Eyes: As noted above  Past Medical History:  Diagnosis Date  . Anxiety associated with depression   . History of melanoma excision    BP 114/69 (BP Location: Left Arm, Patient Position: Sitting, Cuff Size: Normal)   Pulse 87   Temp 98.6 F (37 C) (Oral)   Resp 16   Ht 5\' 3"  (1.6 m)   Wt 128 lb 12.8 oz (58.4 kg)   SpO2 97%   BMI 22.82 kg/m  Gen: Awake, alert, appears stated age Eyes: Lids with spherical mass on R upper lid, Sclera white, PERRLA, EOMi Psych: Age appropriate judgment and insight; mood and affect normal  Chalazion of right upper eyelid  Info given. Massage with warm compresses. F/u prn. Pt voiced understanding and agreement to the plan.  [place], DO 01/02/18 4:23 PM

## 2018-01-17 ENCOUNTER — Other Ambulatory Visit: Payer: Self-pay | Admitting: Family Medicine

## 2018-01-17 MED ORDER — BUPROPION HCL ER (XL) 300 MG PO TB24: 300.0000 mg | ORAL_TABLET | Freq: Every day | ORAL | 0 refills | Status: DC

## 2018-01-17 NOTE — Telephone Encounter (Signed)
Rx sent 

## 2018-01-17 NOTE — Telephone Encounter (Signed)
Copied from [place] [id number]. Topic: Quick Communication - See Telephone Encounter >> Jan 17, 2018  1:53 PM [name] wrote: CRM for notification. See Telephone encounter for: 01/17/18.  Patient states that she has a physical with Dr Nani Ravens 8/30 but needs a one month refill on her buPROPion (WELLBUTRIN XL) 300 MG 24 hr tablet. She has already switched all her records from her previous office at [facility] and they will not refill it. Please advise.

## 2018-01-30 ENCOUNTER — Ambulatory Visit: Payer: No Typology Code available for payment source | Admitting: Family Medicine

## 2018-02-24 ENCOUNTER — Encounter: Payer: Self-pay | Admitting: Family Medicine

## 2018-02-24 ENCOUNTER — Ambulatory Visit (INDEPENDENT_AMBULATORY_CARE_PROVIDER_SITE_OTHER): Payer: No Typology Code available for payment source | Admitting: Family Medicine

## 2018-02-24 VITALS — BP 110/69 | HR 67 | Temp 98.4°F | Ht 63.0 in | Wt 129.0 lb

## 2018-02-24 DIAGNOSIS — Z1159 Encounter for screening for other viral diseases: Secondary | ICD-10-CM | POA: Diagnosis not present

## 2018-02-24 DIAGNOSIS — Z131 Encounter for screening for diabetes mellitus: Secondary | ICD-10-CM | POA: Diagnosis not present

## 2018-02-24 DIAGNOSIS — Z23 Encounter for immunization: Secondary | ICD-10-CM | POA: Diagnosis not present

## 2018-02-24 DIAGNOSIS — Z0000 Encounter for general adult medical examination without abnormal findings: Secondary | ICD-10-CM

## 2018-02-24 LAB — LIPID PANEL
CHOLESTEROL: 218 mg/dL — AB (ref 0–200)
HDL: 62.1 mg/dL (ref >39.00)
LDL CALC: 124 mg/dL — AB (ref 0–99)
NonHDL: 156.36
TRIGLYCERIDES: 164 mg/dL — AB (ref 0.0–149.0)
Total CHOL/HDL Ratio: 4
VLDL: 32.8 mg/dL (ref 0.0–40.0)

## 2018-02-24 LAB — COMPREHENSIVE METABOLIC PANEL
ALT: 15 U/L (ref 0–35)
AST: 16 U/L (ref 0–37)
Albumin: 4.3 g/dL (ref 3.5–5.2)
Alkaline Phosphatase: 61 U/L (ref 39–117)
BILIRUBIN TOTAL: 0.4 mg/dL (ref 0.2–1.2)
BUN: 9 mg/dL (ref 6–23)
CHLORIDE: 105 meq/L (ref 96–112)
CO2: 30 meq/L (ref 19–32)
Calcium: 9.5 mg/dL (ref 8.4–10.5)
Creatinine, Ser: 0.7 mg/dL (ref 0.40–1.20)
GFR: 87.73 mL/min (ref >60.00)
GLUCOSE: 85 mg/dL (ref 70–99)
POTASSIUM: 4.7 meq/L (ref 3.5–5.1)
Sodium: 141 mEq/L (ref 135–145)
Total Protein: 6.4 g/dL (ref 6.0–8.3)

## 2018-02-24 LAB — HEMOGLOBIN A1C: Hgb A1c MFr Bld: 5.7 % (ref 4.6–6.5)

## 2018-02-24 MED ORDER — BUPROPION HCL ER (XL) 300 MG PO TB24: 300.0000 mg | ORAL_TABLET | Freq: Every day | ORAL | 3 refills | Status: DC

## 2018-02-24 NOTE — Patient Instructions (Addendum)
Give [place] 2-3 business days to get the results of your labs back.   Keep up the good work.   Let us know if you need anything.

## 2018-02-24 NOTE — Progress Notes (Signed)
Chief Complaint  Patient presents with  . Annual Exam     Well Woman Ariana Mcfarland is here for a complete physical.   Her last physical was >1 year ago.  Current diet: in general, a "healthy" diet. Current exercise: walking and stretching. Weight is stable and shedenies daytime fatigue. No LMP recorded. Patient is postmenopausal. Seatbelt? Yes  Health Maintenance Colonoscopy- Yes Shingrix- No DEXA- Yes Mammogram- Yes Tetanus- Yes Pneumonia- No Hep C screen- No  Past Medical History:  Diagnosis Date  . Anxiety associated with depression   . History of melanoma excision      Past Surgical History:  Procedure Laterality Date  . BASAL CELL CARCINOMA EXCISION     x 3   . MELANOMA EXCISION  2004    Medications  Current Outpatient Medications on File Prior to Visit  Medication Sig Dispense Refill  . Ascorbic Acid (VITAMIN C PO) Take 1,000 mg by mouth daily.     Marland Kitchen buPROPion (WELLBUTRIN XL) 300 MG 24 hr tablet Take 1 tablet (300 mg total) by mouth daily. 90 tablet 0  . CALCIUM PO Take by mouth daily.    . Cholecalciferol (VITAMIN D PO) Take by mouth.    . RESVERATROL PO Take by mouth daily.     Allergies Allergies  Allergen Reactions  . Sulfur   . Sulfa Antibiotics     Headache   Review of Systems: Constitutional:  no sweats Eye:  no recent significant change in vision Ear/Nose/Mouth/Throat:  Ears:  No changes in hearing Nose/Mouth/Throat:  no complaints of nasal congestion, no sore throat Cardiovascular: no chest pain Respiratory:  No cough and no shortness of breath Gastrointestinal:  no abdominal pain, no change in bowel habits GU:  Female: negative for dysuria or pelvic pain Musculoskeletal/Extremities:  no pain of the joints Integumentary (Skin/Breast):  no abnormal skin lesions reported Neurologic:  no headaches Psychiatric:  no anxiety, no depression Endocrine:  denies unexplained weight changes Hematologic/Lymphatic:  no abnormal bleeding  Exam BP  110/69 (BP Location: Left Arm, Patient Position: Sitting, Cuff Size: Normal)   Pulse 67   Temp 98.4 F (36.9 C) (Oral)   Ht 5\' 3"  (1.6 m)   Wt 129 lb (58.5 kg)   SpO2 98%   BMI 22.85 kg/m  General:  well developed, well nourished, in no apparent distress Skin:  no significant moles, warts, or growths Head:  no masses, lesions, or tenderness Eyes:  pupils equal and round, sclera anicteric without injection Ears:  canals without lesions, TMs shiny without retraction, no obvious effusion, no erythema Nose:  nares patent, septum midline, mucosa normal, and no drainage or sinus tenderness Throat/Pharynx:  lips and gingiva without lesion; tongue and uvula midline; non-inflamed pharynx; no exudates or postnasal drainage Neck: neck supple without adenopathy, thyromegaly, or masses Lungs:  clear to auscultation, breath sounds equal bilaterally, no respiratory distress Cardio:  regular rate and rhythm, no bruits or LE edema Abdomen:  abdomen soft, nontender; bowel sounds normal; no masses or organomegaly Genital: Deferred Musculoskeletal:  symmetrical muscle groups noted without atrophy or deformity Extremities:  no clubbing, cyanosis, or edema, no deformities, no skin discoloration Neuro:  gait normal; deep tendon reflexes normal and symmetric Psych: well oriented with normal range of affect and appropriate judgment/insight  Assessment and Plan  Well adult exam - Plan: Comprehensive metabolic panel, Lipid panel  Encounter for hepatitis C screening test for low risk patient - Plan: Hepatitis C antibody  Screening for diabetes mellitus - Plan: Hemoglobin  A1c  Need for vaccination against Streptococcus pneumoniae - Plan: Pneumococcal conjugate vaccine 13-valent   Well 72 y.o. female. Counseled on diet and exercise. Other orders as above. Next pneumonia vaccine in 1 year.  Refuses shingles vaccine, she will let us know if she changes her mind.  Recommend getting the flu shot in  October. Follow up in 1 year pending the above workup. The patient voiced understanding and agreement to the plan.  [place], DO 02/24/18 10:53 AM

## 2018-02-24 NOTE — Progress Notes (Signed)
Pre visit review using our clinic review tool, if applicable. No additional management support is needed unless otherwise documented below in the visit note. 

## 2018-02-28 ENCOUNTER — Ambulatory Visit: Payer: No Typology Code available for payment source | Admitting: Physical Therapy

## 2018-02-28 LAB — HEPATITIS C ANTIBODY
Hepatitis C Ab: NONREACTIVE
SIGNAL TO CUT-OFF: 0.01 (ref <1.00)

## 2018-03-27 ENCOUNTER — Other Ambulatory Visit: Payer: Self-pay | Admitting: Family Medicine

## 2018-03-27 ENCOUNTER — Telehealth: Payer: Self-pay | Admitting: Family Medicine

## 2018-03-27 DIAGNOSIS — Z1239 Encounter for other screening for malignant neoplasm of breast: Secondary | ICD-10-CM

## 2018-03-27 NOTE — Telephone Encounter (Signed)
OK to do. TY.  

## 2018-03-27 NOTE — Telephone Encounter (Signed)
Copied from [place] [id number]. Topic: General - Other >> Mar 27, 2018  9:36 AM Valla Leaver wrote: Reason for CRM: Patient would like a mammo preferably in [place] and secondary location in [place]. Please call patient to notify once order is placed.

## 2018-03-27 NOTE — Telephone Encounter (Signed)
Spoke to the patient and she wants the order to be entered at our office ([facility])--order has been entered.

## 2018-05-08 ENCOUNTER — Ambulatory Visit (HOSPITAL_BASED_OUTPATIENT_CLINIC_OR_DEPARTMENT_OTHER)
Admission: RE | Admit: 2018-05-08 | Discharge: 2018-05-08 | Disposition: A | Discharge status: Home / Self Care | Payer: No Typology Code available for payment source | Source: Ambulatory Visit | Attending: Family Medicine | Admitting: Family Medicine

## 2018-05-08 DIAGNOSIS — Z1239 Encounter for other screening for malignant neoplasm of breast: Secondary | ICD-10-CM | POA: Insufficient documentation

## 2019-01-22 ENCOUNTER — Encounter: Payer: Self-pay | Admitting: Family Medicine

## 2019-01-24 ENCOUNTER — Ambulatory Visit: Payer: No Typology Code available for payment source | Admitting: Family Medicine

## 2019-01-24 ENCOUNTER — Encounter: Payer: Self-pay | Admitting: Family Medicine

## 2019-01-24 ENCOUNTER — Other Ambulatory Visit: Payer: Self-pay

## 2019-01-24 VITALS — BP 112/80 | HR 65 | Temp 98.2°F | Ht 63.0 in | Wt 129.0 lb

## 2019-01-24 DIAGNOSIS — R300 Dysuria: Secondary | ICD-10-CM | POA: Diagnosis not present

## 2019-01-24 LAB — POCT URINALYSIS DIPSTICK
Bilirubin, UA: NEGATIVE
Blood, UA: NEGATIVE
Glucose, UA: NEGATIVE
Ketones, UA: NEGATIVE
Leukocytes, UA: NEGATIVE
Nitrite, UA: NEGATIVE
Protein, UA: NEGATIVE
Spec Grav, UA: 1.01 (ref 1.010–1.025)
Urobilinogen, UA: NEGATIVE E.U./dL — AB
pH, UA: 7 (ref 5.0–8.0)

## 2019-01-24 NOTE — Patient Instructions (Signed)
Stay hydrated.  ? ?Warning signs/symptoms: Uncontrollable nausea/vomiting, fevers, worsening symptoms despite treatment, confusion. ? ?Give us around 2 business days to get culture back to you. ? ?Try MiraLAX 1-2 times daily over the next 3-4 days. If no improvement, try using an enema. Stay well hydrated and keep lots of fiber in your diet. ? ?Let us know if you need anything. ?

## 2019-01-24 NOTE — Progress Notes (Signed)
Chief Complaint  Patient presents with  . Dysuria    abdominal pressure    Ariana Mcfarland is a 73 y.o. female here for possible UTI.  Duration: 5 days. Symptoms: urinary frequency, urinary hesitancy, urinary retention and abd pressure, constipation Denies: hematuria, fever, nausea, vomiting, vaginal discharge Hx of recurrent UTI? No Denies new sexual partners.  ROS:  Constitutional: denies fever GU: As noted in HPI  Past Medical History:  Diagnosis Date  . Anxiety associated with depression   . History of melanoma excision     BP 112/80 (BP Location: Left Arm, Patient Position: Sitting, Cuff Size: Normal)   Pulse 65   Temp 98.2 F (36.8 C) (Oral)   Ht 5\' 3"  (1.6 m)   Wt 129 lb (58.5 kg)   SpO2 97%   BMI 22.85 kg/m  General: Awake, alert, appears stated age Heart: RRR Lungs: CTAB, normal respiratory effort, no accessory muscle usage Abd: BS+, soft, NT, ND, no masses or organomegaly MSK: No CVA tenderness, neg Lloyd's sign Psych: Age appropriate judgment and insight  Dysuria - Plan: POCT Urinalysis Dipstick, Urine Culture, UA neg, will tx for constipation. Elected to wait on cx prior to tx.   Orders as above. Stay hydrated. Seek immediate care if pt starts to develop fevers, new/worsening symptoms, uncontrollable N/V. F/u prn. The patient voiced understanding and agreement to the plan.  [place], DO 01/24/19 11:03 AM

## 2019-01-26 ENCOUNTER — Other Ambulatory Visit: Payer: Self-pay | Admitting: Family Medicine

## 2019-01-26 LAB — URINE CULTURE
MICRO NUMBER:: 715942
SPECIMEN QUALITY:: ADEQUATE

## 2019-01-26 MED ORDER — NITROFURANTOIN MACROCRYSTAL 100 MG PO CAPS: 100.0000 mg | ORAL_CAPSULE | Freq: Two times a day (BID) | ORAL | 0 refills | Status: AC

## 2019-03-26 ENCOUNTER — Telehealth: Payer: Self-pay | Admitting: Family Medicine

## 2019-03-26 MED ORDER — BUPROPION HCL ER (XL) 300 MG PO TB24: 300.0000 mg | ORAL_TABLET | Freq: Every day | ORAL | 3 refills | Status: DC

## 2019-03-26 NOTE — Telephone Encounter (Signed)
rx refill buPROPion (WELLBUTRIN XL) 300 MG 24 hr tablet  [id number] - [place], [place] [phone] (Phone) [phone] (Fax)

## 2019-08-01 ENCOUNTER — Telehealth: Payer: Self-pay | Admitting: Family Medicine

## 2019-08-01 NOTE — Telephone Encounter (Signed)
Yes, that's fine 

## 2019-08-01 NOTE — Telephone Encounter (Signed)
Patient informed of PCP instructions. 

## 2019-08-01 NOTE — Telephone Encounter (Signed)
Caller Name: Ben Phone: [phone]  Pt said last week she was 10 days ago with fatigue, chest congestion. She has a lingering cough at night but otherwise feeling fine. She tested negative for COVID-19 07/23/2019. No new symptoms for at least 5 days. Pt asking if she should still have the 1st dose of the vaccine 2/4. Please advise.

## 2019-08-17 ENCOUNTER — Other Ambulatory Visit (HOSPITAL_BASED_OUTPATIENT_CLINIC_OR_DEPARTMENT_OTHER): Payer: Self-pay | Admitting: Family Medicine

## 2019-08-17 DIAGNOSIS — Z1231 Encounter for screening mammogram for malignant neoplasm of breast: Secondary | ICD-10-CM

## 2019-09-20 ENCOUNTER — Ambulatory Visit (HOSPITAL_BASED_OUTPATIENT_CLINIC_OR_DEPARTMENT_OTHER): Payer: No Typology Code available for payment source

## 2019-10-11 ENCOUNTER — Ambulatory Visit (HOSPITAL_BASED_OUTPATIENT_CLINIC_OR_DEPARTMENT_OTHER)
Admission: RE | Admit: 2019-10-11 | Discharge: 2019-10-11 | Disposition: A | Discharge status: Home / Self Care | Payer: No Typology Code available for payment source | Source: Ambulatory Visit | Attending: Family Medicine | Admitting: Family Medicine

## 2019-10-11 ENCOUNTER — Other Ambulatory Visit: Payer: Self-pay

## 2019-10-11 DIAGNOSIS — Z1231 Encounter for screening mammogram for malignant neoplasm of breast: Secondary | ICD-10-CM | POA: Diagnosis present

## 2020-02-04 ENCOUNTER — Ambulatory Visit: Payer: No Typology Code available for payment source | Admitting: Family Medicine

## 2020-02-04 ENCOUNTER — Encounter: Payer: Self-pay | Admitting: Family Medicine

## 2020-02-04 ENCOUNTER — Other Ambulatory Visit: Payer: Self-pay

## 2020-02-04 VITALS — BP 126/74 | HR 68 | Temp 97.7°F | Resp 18 | Ht 63.0 in | Wt 126.6 lb

## 2020-02-04 DIAGNOSIS — R109 Unspecified abdominal pain: Secondary | ICD-10-CM | POA: Diagnosis not present

## 2020-02-04 DIAGNOSIS — R350 Frequency of micturition: Secondary | ICD-10-CM

## 2020-02-04 LAB — POC URINALSYSI DIPSTICK (AUTOMATED)
Bilirubin, UA: NEGATIVE
Blood, UA: NEGATIVE
Glucose, UA: NEGATIVE
Ketones, UA: NEGATIVE
Leukocytes, UA: NEGATIVE
Nitrite, UA: NEGATIVE
Protein, UA: NEGATIVE
Spec Grav, UA: 1.01 (ref 1.010–1.025)
Urobilinogen, UA: 0.2 E.U./dL
pH, UA: 6 (ref 5.0–8.0)

## 2020-02-04 LAB — COMPREHENSIVE METABOLIC PANEL
ALT: 19 U/L (ref 0–35)
AST: 19 U/L (ref 0–37)
Albumin: 4 g/dL (ref 3.5–5.2)
Alkaline Phosphatase: 66 U/L (ref 39–117)
BUN: 13 mg/dL (ref 6–23)
CO2: 26 mEq/L (ref 19–32)
Calcium: 9.3 mg/dL (ref 8.4–10.5)
Chloride: 104 mEq/L (ref 96–112)
Creatinine, Ser: 0.84 mg/dL (ref 0.40–1.20)
GFR: 66.52 mL/min (ref >60.00)
Glucose, Bld: 86 mg/dL (ref 70–99)
Potassium: 4.1 mEq/L (ref 3.5–5.1)
Sodium: 139 mEq/L (ref 135–145)
Total Bilirubin: 0.3 mg/dL (ref 0.2–1.2)
Total Protein: 6.7 g/dL (ref 6.0–8.3)

## 2020-02-04 LAB — HEMOGLOBIN A1C: Hgb A1c MFr Bld: 5.8 % (ref 4.6–6.5)

## 2020-02-04 LAB — CBC WITH DIFFERENTIAL/PLATELET
Basophils Absolute: 0 10*3/uL (ref 0.0–0.1)
Basophils Relative: 0.6 % (ref 0.0–3.0)
Eosinophils Absolute: 0.1 10*3/uL (ref 0.0–0.7)
Eosinophils Relative: 1.9 % (ref 0.0–5.0)
HCT: 40.8 % (ref 36.0–46.0)
Hemoglobin: 13.6 g/dL (ref 12.0–15.0)
Lymphocytes Relative: 28 % (ref 12.0–46.0)
Lymphs Abs: 1.6 10*3/uL (ref 0.7–4.0)
MCHC: 33.4 g/dL (ref 30.0–36.0)
MCV: 91.7 fl (ref 78.0–100.0)
Monocytes Absolute: 0.5 10*3/uL (ref 0.1–1.0)
Monocytes Relative: 9 % (ref 3.0–12.0)
Neutro Abs: 3.5 10*3/uL (ref 1.4–7.7)
Neutrophils Relative %: 60.5 % (ref 43.0–77.0)
Platelets: 267 10*3/uL (ref 150.0–400.0)
RBC: 4.45 Mil/uL (ref 3.87–5.11)
RDW: 13.2 % (ref 11.5–15.5)
WBC: 5.8 10*3/uL (ref 4.0–10.5)

## 2020-02-04 MED ORDER — NITROFURANTOIN MONOHYD MACRO 100 MG PO CAPS: 100.0000 mg | ORAL_CAPSULE | Freq: Two times a day (BID) | ORAL | 0 refills | Status: DC

## 2020-02-04 NOTE — Patient Instructions (Signed)
Urinary Frequency, Adult Urinary frequency means urinating more often than usual. You may urinate every 1-2 hours even though you drink a normal amount of fluid and do not have a bladder infection or condition. Although you urinate more often than normal, the total amount of urine produced in a day is normal. With urinary frequency, you may have an urgent need to urinate often. The stress and anxiety of needing to find a bathroom quickly can make this urge worse. This condition may go away on its own or you may need treatment at home. Home treatment may include bladder training, exercises, taking medicines, or making changes to your diet. Follow these instructions at home: Bladder health   Keep a bladder diary if told by your health care provider. Keep track of: ? What you eat and drink. ? How often you urinate. ? How much you urinate.  Follow a bladder training program if told by your health care provider. This may include: ? Learning to delay going to the bathroom. ? Double urinating (voiding). This helps if you are not completely emptying your bladder. ? Scheduled voiding.  Do Kegel exercises as told by your health care provider. Kegel exercises strengthen the muscles that help control urination, which may help the condition. Eating and drinking  If told by your health care provider, make diet changes, such as: ? Avoiding caffeine. ? Drinking fewer fluids, especially alcohol. ? Not drinking in the evening. ? Avoiding foods or drinks that may irritate the bladder. These include coffee, tea, soda, artificial sweeteners, citrus, tomato-based foods, and chocolate. ? Eating foods that help prevent or ease constipation. Constipation can make this condition worse. Your health care provider may recommend that you:  Drink enough fluid to keep your urine pale yellow.  Take over-the-counter or prescription medicines.  Eat foods that are high in fiber, such as beans, whole grains, and fresh  fruits and vegetables.  Limit foods that are high in fat and processed sugars, such as fried or sweet foods. General instructions  Take over-the-counter and prescription medicines only as told by your health care provider.  Keep all follow-up visits as told by your health care provider. This is important. Contact a health care provider if:  You start urinating more often.  You feel pain or irritation when you urinate.  You notice blood in your urine.  Your urine looks cloudy.  You develop a fever.  You begin vomiting. Get help right away if:  You are unable to urinate. Summary  Urinary frequency means urinating more often than usual. With urinary frequency, you may urinate every 1-2 hours even though you drink a normal amount of fluid and do not have a bladder infection or other bladder condition.  Your health care provider may recommend that you keep a bladder diary, follow a bladder training program, or make dietary changes.  If told by your health care provider, do Kegel exercises to strengthen the muscles that help control urination.  Take over-the-counter and prescription medicines only as told by your health care provider.  Contact a health care provider if your symptoms do not improve or get worse. This information is not intended to replace advice given to you by your health care provider. Make sure you discuss any questions you have with your health care provider. Document Revised: 12/22/2017 Document Reviewed: 12/22/2017 Elsevier Patient Education  2020 [employer]  

## 2020-02-04 NOTE — Assessment & Plan Note (Signed)
Check urine culture Pt also requesting ana be check due to extensive family hx lupus

## 2020-02-04 NOTE — Progress Notes (Signed)
Patient ID: Ariana Mcfarland, female    DOB: [birth year]  Age: 74 y.o. MRN: [id number]    Subjective:  Subjective  HPI Maribeth Jiles presents c/o urinary frequency and some dysuria.   She also c/o low back pain-- no injury   She has been carrying her 28 lb grandson   Review of Systems  Constitutional: Negative for appetite change, diaphoresis, fatigue and unexpected weight change.  Eyes: Negative for pain, redness and visual disturbance.  Respiratory: Negative for cough, chest tightness, shortness of breath and wheezing.   Cardiovascular: Negative for chest pain, palpitations and leg swelling.  Endocrine: Negative for cold intolerance, heat intolerance, polydipsia, polyphagia and polyuria.  Genitourinary: Positive for flank pain and frequency. Negative for difficulty urinating and dysuria.  Musculoskeletal: Positive for back pain.  Neurological: Negative for dizziness, light-headedness, numbness and headaches.    History Past Medical History:  Diagnosis Date   Anxiety associated with depression    History of melanoma excision     She has a past surgical history that includes Melanoma excision (2004) and Excision basal cell carcinoma.   Her family history includes Diabetes in her father and mother; Heart disease in her father and mother.She reports that she quit smoking about 17 years ago. She has never used smokeless tobacco. She reports current alcohol use of about 2.0 standard drinks of alcohol per week. She reports that she does not use drugs.  Current Outpatient Medications on File Prior to Visit  Medication Sig Dispense Refill   Ascorbic Acid (VITAMIN C PO) Take 1,000 mg by mouth daily.      buPROPion (WELLBUTRIN XL) 300 MG 24 hr tablet Take 1 tablet (300 mg total) by mouth daily. 90 tablet 3   CALCIUM PO Take by mouth daily.     Cholecalciferol (VITAMIN D PO) Take by mouth.     RESVERATROL PO Take by mouth daily.     No current facility-administered medications on file  prior to visit.     Objective:  Objective  Physical Exam Vitals and nursing note reviewed.  Constitutional:      Appearance: She is well-developed.  HENT:     Head: Normocephalic and atraumatic.  Eyes:     Conjunctiva/sclera: Conjunctivae normal.  Neck:     Thyroid: No thyromegaly.     Vascular: No carotid bruit or JVD.  Cardiovascular:     Rate and Rhythm: Normal rate and regular rhythm.     Heart sounds: Normal heart sounds. No murmur heard.   Pulmonary:     Effort: Pulmonary effort is normal. No respiratory distress.     Breath sounds: Normal breath sounds. No wheezing or rales.  Chest:     Chest wall: No tenderness.  Musculoskeletal:        General: No tenderness. Normal range of motion.     Cervical back: Normal range of motion and neck supple.  Neurological:     Mental Status: She is alert and oriented to person, place, and time.    BP 126/74 (BP Location: Right Arm, Patient Position: Sitting, Cuff Size: Normal)    Pulse 68    Temp 97.7 F (36.5 C) (Oral)    Resp 18    Ht 5\' 3"  (1.6 m)    Wt 126 lb 9.6 oz (57.4 kg)    SpO2 98%    BMI 22.43 kg/m  Wt Readings from Last 3 Encounters:  02/04/20 126 lb 9.6 oz (57.4 kg)  01/24/19 129 lb (58.5 kg)  02/24/18 129 lb (  58.5 kg)     Lab Results  Component Value Date   WBC 5.2 11/10/2016   HGB 13.5 11/10/2016   HCT 40.6 11/10/2016   PLT 286 11/10/2016   GLUCOSE 85 02/24/2018   CHOL 218 (H) 02/24/2018   TRIG 164.0 (H) 02/24/2018   HDL 62.10 02/24/2018   LDLCALC 124 (H) 02/24/2018   ALT 15 02/24/2018   AST 16 02/24/2018   NA 141 02/24/2018   K 4.7 02/24/2018   CL 105 02/24/2018   CREATININE 0.70 02/24/2018   BUN 9 02/24/2018   CO2 30 02/24/2018   TSH 1.30 11/10/2016   INR 0.9 07/13/2016   HGBA1C 5.7 02/24/2018    MM 3D SCREEN BREAST BILATERAL  Result Date: 10/11/2019 CLINICAL DATA:  Screening. EXAM: DIGITAL SCREENING BILATERAL MAMMOGRAM WITH TOMO AND CAD COMPARISON:  Previous exam(s). ACR Breast Density  Category b: There are scattered areas of fibroglandular density. FINDINGS: There are no findings suspicious for malignancy. Images were processed with CAD. IMPRESSION: No mammographic evidence of malignancy. A result letter of this screening mammogram will be mailed directly to the patient. RECOMMENDATION: Screening mammogram in one year. (Code:SM-B-01Y) BI-RADS CATEGORY  1: Negative. Electronically Signed   By: Abelardo Diesel M.D.   On: 10/11/2019 14:38     Assessment & Plan:  Plan  I am having Soul Deveney "Jan" start on nitrofurantoin (macrocrystal-monohydrate). I am also having her maintain her Ascorbic Acid (VITAMIN C PO), CALCIUM PO, RESVERATROL PO, Cholecalciferol (VITAMIN D PO), and buPROPion.  Meds ordered this encounter  Medications   nitrofurantoin, macrocrystal-monohydrate, (MACROBID) 100 MG capsule    Sig: Take 1 capsule (100 mg total) by mouth 2 (two) times daily.    Dispense:  14 capsule    Refill:  0    Problem List Items Addressed This Visit      Unprioritized   Urinary frequency - Primary    Culture pending--- rx given to pt to take if symptoms worsen but otherwise she will wait for culture  Pt also with family hx of dm and would like to be checked for DM Labs ordered       Relevant Medications   nitrofurantoin, macrocrystal-monohydrate, (MACROBID) 100 MG capsule   Other Relevant Orders   POCT Urinalysis Dipstick (Automated) (Completed)   Urine Culture   Hemoglobin A1c   Comprehensive metabolic panel   CBC with Differential/Platelet    Other Visit Diagnoses    Flank pain       Relevant Orders   Comprehensive metabolic panel   CBC with Differential/Platelet   Antinuclear Antib (ANA)      Follow-up: No follow-ups on file.  Ann Held, DO

## 2020-02-04 NOTE — Assessment & Plan Note (Signed)
Culture pending--- rx given to pt to take if symptoms worsen but otherwise she will wait for culture  Pt also with family hx of dm and would like to be checked for DM Labs ordered

## 2020-02-05 LAB — ANA: Anti Nuclear Antibody (ANA): NEGATIVE

## 2020-02-05 LAB — URINE CULTURE
MICRO NUMBER:: 10802610
Result:: NO GROWTH
SPECIMEN QUALITY:: ADEQUATE

## 2020-02-06 ENCOUNTER — Encounter: Payer: Self-pay | Admitting: Family Medicine

## 2020-02-06 NOTE — Telephone Encounter (Signed)
Please advise 

## 2020-02-06 NOTE — Telephone Encounter (Signed)
For urinary frequency we normally refer to urology ----- can refer to Dr Claudia Desanctis at [facility]-- if pt agrees  Pt can call a gyn--- we can give her the number for across the hall [facility] [place] --- she can make appointment

## 2020-03-06 ENCOUNTER — Ambulatory Visit (INDEPENDENT_AMBULATORY_CARE_PROVIDER_SITE_OTHER): Payer: No Typology Code available for payment source | Admitting: Family Medicine

## 2020-03-06 ENCOUNTER — Other Ambulatory Visit: Payer: Self-pay

## 2020-03-06 ENCOUNTER — Encounter: Payer: Self-pay | Admitting: Family Medicine

## 2020-03-06 VITALS — BP 110/55 | HR 86 | Ht 63.0 in | Wt 129.0 lb

## 2020-03-06 DIAGNOSIS — Z01419 Encounter for gynecological examination (general) (routine) without abnormal findings: Secondary | ICD-10-CM | POA: Diagnosis not present

## 2020-03-06 NOTE — Progress Notes (Signed)
GYNECOLOGY ANNUAL PREVENTATIVE CARE ENCOUNTER NOTE  Subjective:   Ariana Mcfarland is a 74 y.o. [id number] female here for a routine annual gynecologic exam.  Current complaints: none.   Denies abnormal vaginal bleeding, discharge, pelvic pain, problems with intercourse or other gynecologic concerns.    No history of gyn surgeries. All normal PAP, except an ASCUS with normal biopsies.  Menopause at age [age]. Mild symptoms, no HRT.  Gynecologic History No LMP recorded. Patient is postmenopausal. Patient is not sexually active  Contraception: post menopausal status Last Pap: n/a.  Last mammogram: 09/2019. Results were: normal  Obstetric History OB History  Gravida Para Term Preterm AB Living  2 2 2  0 0 2  SAB TAB Ectopic Multiple Live Births  0 0 0 0 2    # Outcome Date GA Lbr Len/2nd Weight Sex Delivery Anes PTL Lv  2 Term      Vag-Spont     1 Term      Vag-Spont       Past Medical History:  Diagnosis Date  . Anxiety associated with depression   . History of melanoma excision   . Vaginal Pap smear, abnormal     Past Surgical History:  Procedure Laterality Date  . BASAL CELL CARCINOMA EXCISION     x 3   . MELANOMA EXCISION  2004    Current Outpatient Medications on File Prior to Visit  Medication Sig Dispense Refill  . Ascorbic Acid (VITAMIN C PO) Take 1,000 mg by mouth daily.     Marland Kitchen buPROPion (WELLBUTRIN XL) 300 MG 24 hr tablet Take 1 tablet (300 mg total) by mouth daily. 90 tablet 3  . CALCIUM PO Take by mouth daily.    . Cholecalciferol (VITAMIN D PO) Take by mouth.    . RESVERATROL PO Take by mouth daily.    . nitrofurantoin, macrocrystal-monohydrate, (MACROBID) 100 MG capsule Take 1 capsule (100 mg total) by mouth 2 (two) times daily. (Patient not taking: Reported on 03/06/2020) 14 capsule 0   No current facility-administered medications on file prior to visit.    Allergies  Allergen Reactions  . Sulfur   . Sulfa Antibiotics     Headache    Social History    Socioeconomic History  . Marital status: Married    Spouse name: Not on file  . Number of children: Not on file  . Years of education: Not on file  . Highest education level: Not on file  Occupational History  . Not on file  Tobacco Use  . Smoking status: Former Smoker    Quit date: 10/13/2002    Years since quitting: 17.4  . Smokeless tobacco: Never Used  Substance and Sexual Activity  . Alcohol use: Yes    Alcohol/week: 2.0 standard drinks    Types: 2 Standard drinks or equivalent per week  . Drug use: No  . Sexual activity: Never  Other Topics Concern  . Not on file  Social History Narrative  . Not on file   Social Determinants of Health   Financial Resource Strain:   . Difficulty of Paying Living Expenses: Not on file  Food Insecurity:   . Worried About Charity fundraiser in the Last Year: Not on file  . Ran Out of Food in the Last Year: Not on file  Transportation Needs:   . Lack of Transportation (Medical): Not on file  . Lack of Transportation (Non-Medical): Not on file  Physical Activity:   . Days of  Exercise per Week: Not on file  . Minutes of Exercise per Session: Not on file  Stress:   . Feeling of Stress : Not on file  Social Connections:   . Frequency of Communication with Friends and Family: Not on file  . Frequency of Social Gatherings with Friends and Family: Not on file  . Attends Religious Services: Not on file  . Active Member of Clubs or Organizations: Not on file  . Attends Archivist Meetings: Not on file  . Marital Status: Not on file  Intimate Partner Violence:   . Fear of Current or Ex-Partner: Not on file  . Emotionally Abused: Not on file  . Physically Abused: Not on file  . Sexually Abused: Not on file    Family History  Problem Relation Age of Onset  . Diabetes Mother   . Heart disease Mother   . Diabetes Father   . Heart disease Father     The following portions of the patient's history were reviewed and updated  as appropriate: allergies, current medications, past family history, past medical history, past social history, past surgical history and problem list.  Review of Systems Pertinent items are noted in HPI.   Objective:  BP (!) 110/55   Pulse 86   Ht 5\' 3"  (1.6 m)   Wt 129 lb (58.5 kg)   BMI 22.85 kg/m  Wt Readings from Last 3 Encounters:  03/06/20 129 lb (58.5 kg)  02/04/20 126 lb 9.6 oz (57.4 kg)  01/24/19 129 lb (58.5 kg)     Chaperone present during exam  CONSTITUTIONAL: Well-developed, well-nourished female in no acute distress.  HENT:  Normocephalic, atraumatic, External right and left ear normal. Oropharynx is clear and moist EYES: Conjunctivae and EOM are normal. Pupils are equal, round, and reactive to light. No scleral icterus.  NECK: Normal range of motion, supple, no masses.  Normal thyroid.   CARDIOVASCULAR: Normal heart rate noted, regular rhythm RESPIRATORY: Clear to auscultation bilaterally. Effort and breath sounds normal, no problems with respiration noted. BREASTS: Symmetric in size. No masses, skin changes, nipple drainage, or lymphadenopathy. ABDOMEN: Soft, normal bowel sounds, no distention noted.  No tenderness, rebound or guarding.  PELVIC: Normal appearing external genitalia; atrophic appearing vaginal mucosa and cervix.  No abnormal discharge noted.  MUSCULOSKELETAL: Normal range of motion. No tenderness.  No cyanosis, clubbing, or edema.  2+ distal pulses. SKIN: Skin is warm and dry. No rash noted. Not diaphoretic. No erythema. No pallor. NEUROLOGIC: Alert and oriented to person, place, and time. Normal reflexes, muscle tone coordination. No cranial nerve deficit noted. PSYCHIATRIC: Normal mood and affect. Normal behavior. Normal judgment and thought content.  Assessment:  Annual gynecologic examination with pap smear   Plan:  1. Well Woman Exam Mammogram reviewed As patient over 8, no PAP smear indicated. No other gyn issues currently.   Routine  preventative health maintenance measures emphasized. Please refer to After Visit Summary for other counseling recommendations.    Loma [place], [place] for [employer]

## 2020-04-08 ENCOUNTER — Other Ambulatory Visit: Payer: Self-pay | Admitting: Family Medicine

## 2020-12-22 ENCOUNTER — Telehealth: Payer: Self-pay | Admitting: Family Medicine

## 2020-12-22 ENCOUNTER — Other Ambulatory Visit: Payer: Self-pay | Admitting: Family Medicine

## 2020-12-22 DIAGNOSIS — E782 Mixed hyperlipidemia: Secondary | ICD-10-CM

## 2020-12-22 DIAGNOSIS — R7303 Prediabetes: Secondary | ICD-10-CM

## 2020-12-22 NOTE — Telephone Encounter (Signed)
OK: CBC, CMP, a1c, lipid panel; dx prediabetes and mixed hyperlipidemia. Ty.

## 2020-12-22 NOTE — Telephone Encounter (Signed)
Patient is wanting to go in for labs before the appt in oct  Can we insert for her?

## 2020-12-22 NOTE — Telephone Encounter (Signed)
Patient contacted and scheduled. Labs ordered.

## 2021-01-06 ENCOUNTER — Telehealth: Payer: Self-pay | Admitting: General Practice

## 2021-01-06 NOTE — Telephone Encounter (Signed)
Left message for patient to contact our office to schedule Annual Exam with Dr. Nehemiah Settle in September 2022.

## 2021-01-15 ENCOUNTER — Other Ambulatory Visit (HOSPITAL_BASED_OUTPATIENT_CLINIC_OR_DEPARTMENT_OTHER): Payer: Self-pay | Admitting: Family Medicine

## 2021-01-15 DIAGNOSIS — Z1231 Encounter for screening mammogram for malignant neoplasm of breast: Secondary | ICD-10-CM

## 2021-03-10 ENCOUNTER — Telehealth: Payer: Self-pay | Admitting: Family Medicine

## 2021-03-10 NOTE — Telephone Encounter (Signed)
I would have to see her in person to evaluate and see if it warrants an urgent referral. That would be based on medical necessity rather than convenience as well.

## 2021-03-10 NOTE — Telephone Encounter (Signed)
Patient would to know if she can get a referral to an optometrist for the cyst in her eye. She states she has an appointment for one but it's not until the beginning of October. She is hoping that there's something Dr. Nani Ravens can do to get her in sooner for the cyst in her eye. She stated she has called other places and most of them have given her around the same appointment date. Please advice.

## 2021-03-10 NOTE — Telephone Encounter (Signed)
Patient scheduled appt.

## 2021-03-10 NOTE — Telephone Encounter (Signed)
Called left message to call back 

## 2021-03-11 ENCOUNTER — Other Ambulatory Visit: Payer: Self-pay

## 2021-03-11 ENCOUNTER — Encounter: Payer: Self-pay | Admitting: Family Medicine

## 2021-03-11 ENCOUNTER — Ambulatory Visit: Payer: No Typology Code available for payment source | Admitting: Family Medicine

## 2021-03-11 VITALS — BP 108/68 | HR 68 | Temp 98.0°F | Ht 62.0 in | Wt 129.4 lb

## 2021-03-11 DIAGNOSIS — H0014 Chalazion left upper eyelid: Secondary | ICD-10-CM

## 2021-03-11 NOTE — Patient Instructions (Addendum)
If you do not hear anything about your referral in the next week or so, call our office and ask for an update.  Let us know if you need anything.

## 2021-03-11 NOTE — Progress Notes (Signed)
Chief Complaint  Patient presents with   left eyelid problem    Ariana Mcfarland is a 75 y.o. female here for a skin complaint.  Duration: 1 month Location: L upper eyelid Pruritic? No Painful? Yes- stinging sometimes Drainage? No New soaps/lotions/topicals/detergents? No Sick contacts? No Other associated symptoms: no vision changes, it seems more formed but not necessarily bigger Therapies tried thus far: warm compresses, doxycycline  Past Medical History:  Diagnosis Date   Anxiety associated with depression    History of melanoma excision    Vaginal Pap smear, abnormal     BP 108/68   Pulse 68   Temp 98 F (36.7 C) (Oral)   Ht '5\' 2"'$  (1.575 m)   Wt 129 lb 6 oz (58.7 kg)   SpO2 97%   BMI 23.66 kg/m  Gen: awake, alert, appearing stated age Lungs: No accessory muscle use Skin: on LU lid, 0.5 cm circular lesion. No drainage, erythema, color changes, TTP, fluctuance, excoriation Psych: Age appropriate judgment and insight  Chalazion left upper eyelid - Plan: Ambulatory referral to Ophthalmology  Reassured the patient that she does not need any further treatment prior to seeing the specialist.  This is nothing urgent or emergent.  She has an appointment on 10/21 with her regular ophthalmologist, we will see if we can get her in sooner with a different group but not necessary. F/u prn. The patient voiced understanding and agreement to the plan.  [place], DO 03/11/21 11:51 AM

## 2021-04-01 ENCOUNTER — Other Ambulatory Visit (INDEPENDENT_AMBULATORY_CARE_PROVIDER_SITE_OTHER): Payer: No Typology Code available for payment source

## 2021-04-01 ENCOUNTER — Other Ambulatory Visit: Payer: Self-pay | Admitting: Family Medicine

## 2021-04-01 ENCOUNTER — Other Ambulatory Visit: Payer: Self-pay

## 2021-04-01 DIAGNOSIS — E782 Mixed hyperlipidemia: Secondary | ICD-10-CM | POA: Diagnosis not present

## 2021-04-01 DIAGNOSIS — R7303 Prediabetes: Secondary | ICD-10-CM

## 2021-04-01 LAB — COMPREHENSIVE METABOLIC PANEL
ALT: 16 U/L (ref 0–35)
AST: 16 U/L (ref 0–37)
Albumin: 4 g/dL (ref 3.5–5.2)
Alkaline Phosphatase: 67 U/L (ref 39–117)
BUN: 11 mg/dL (ref 6–23)
CO2: 27 mEq/L (ref 19–32)
Calcium: 8.7 mg/dL (ref 8.4–10.5)
Chloride: 106 mEq/L (ref 96–112)
Creatinine, Ser: 0.67 mg/dL (ref 0.40–1.20)
GFR: 86.44 mL/min (ref >60.00)
Glucose, Bld: 85 mg/dL (ref 70–99)
Potassium: 4.3 mEq/L (ref 3.5–5.1)
Sodium: 139 mEq/L (ref 135–145)
Total Bilirubin: 0.3 mg/dL (ref 0.2–1.2)
Total Protein: 6.1 g/dL (ref 6.0–8.3)

## 2021-04-01 LAB — LIPID PANEL
Cholesterol: 225 mg/dL — ABNORMAL HIGH (ref 0–200)
HDL: 50.1 mg/dL (ref >39.00)
NonHDL: 175.18
Total CHOL/HDL Ratio: 4
Triglycerides: 204 mg/dL — ABNORMAL HIGH (ref 0.0–149.0)
VLDL: 40.8 mg/dL — ABNORMAL HIGH (ref 0.0–40.0)

## 2021-04-01 LAB — CBC
HCT: 39.6 % (ref 36.0–46.0)
Hemoglobin: 13 g/dL (ref 12.0–15.0)
MCHC: 33 g/dL (ref 30.0–36.0)
MCV: 91.3 fl (ref 78.0–100.0)
Platelets: 263 10*3/uL (ref 150.0–400.0)
RBC: 4.33 Mil/uL (ref 3.87–5.11)
RDW: 12.8 % (ref 11.5–15.5)
WBC: 6.1 10*3/uL (ref 4.0–10.5)

## 2021-04-01 LAB — LDL CHOLESTEROL, DIRECT: Direct LDL: 136 mg/dL

## 2021-04-01 LAB — HEMOGLOBIN A1C: Hgb A1c MFr Bld: 5.8 % (ref 4.6–6.5)

## 2021-04-08 ENCOUNTER — Encounter: Payer: No Typology Code available for payment source | Admitting: Family Medicine

## 2021-04-15 ENCOUNTER — Ambulatory Visit: Payer: No Typology Code available for payment source | Admitting: Family Medicine

## 2021-04-15 ENCOUNTER — Other Ambulatory Visit: Payer: Self-pay

## 2021-04-15 ENCOUNTER — Encounter: Payer: Self-pay | Admitting: Family Medicine

## 2021-04-15 VITALS — BP 114/68 | HR 80 | Temp 98.3°F | Ht 62.0 in | Wt 128.5 lb

## 2021-04-15 DIAGNOSIS — J309 Allergic rhinitis, unspecified: Secondary | ICD-10-CM | POA: Diagnosis not present

## 2021-04-15 MED ORDER — ALBUTEROL SULFATE HFA 108 (90 BASE) MCG/ACT IN AERS
2.0000 | INHALATION_SPRAY | Freq: Four times a day (QID) | RESPIRATORY_TRACT | 0 refills | Status: DC | PRN
Start: 2021-04-15 — End: 2021-05-07

## 2021-04-15 MED ORDER — FLUTICASONE PROPIONATE 50 MCG/ACT NA SUSP: 2.0000 | Freq: Every day | NASAL | 2 refills | Status: DC

## 2021-04-15 MED ORDER — LEVOCETIRIZINE DIHYDROCHLORIDE 5 MG PO TABS: 5.0000 mg | ORAL_TABLET | Freq: Every evening | ORAL | 2 refills | Status: DC

## 2021-04-15 NOTE — Progress Notes (Signed)
Chief Complaint  Patient presents with   Cough    Chest congestion and sore throat for 2 weeks.    Ariana Mcfarland here for URI complaints.  Duration: 2 weeks  Associated symptoms: sinus congestion, rhinorrhea, itchy watery eyes, sore throat, chest tightness, and coughing Denies: sinus pain, ear fullness, ear pain, ear drainage, sore throat, wheezing, shortness of breath, myalgia, and fevers Treatment to date: Tylenol, Alka-Seltzer Sick contacts: No  Past Medical History:  Diagnosis Date   Anxiety associated with depression    History of melanoma excision    Vaginal Pap smear, abnormal     Objective BP 114/68   Pulse 80   Temp 98.3 F (36.8 C) (Oral)   Ht 5\' 2"  (1.575 m)   Wt 128 lb 8 oz (58.3 kg)   SpO2 95%   BMI 23.50 kg/m  General: Awake, alert, appears stated age HEENT: AT, [place], ears patent b/l and TM's neg, nares patent w/o discharge, pharynx pink and without exudates, MMM Neck: No masses or asymmetry Heart: RRR Lungs: CTAB, no accessory muscle use Psych: Age appropriate judgment and insight, normal mood and affect  Allergic rhinitis, unspecified seasonality, unspecified trigger - Plan: levocetirizine (XYZAL) 5 MG tablet, fluticasone (FLONASE) 50 MCG/ACT nasal spray, albuterol (VENTOLIN HFA) 108 (90 Base) MCG/ACT inhaler  Continue to push fluids, practice good hand hygiene, cover mouth when coughing. F/u prn. If starting to experience fevers, shaking, or shortness of breath, seek immediate care. Pt voiced understanding and agreement to the plan.  [place], DO 04/15/21 1:31 PM

## 2021-04-15 NOTE — Patient Instructions (Signed)
Claritin (loratadine), Allegra (fexofenadine), Zyrtec (cetirizine) which is also equivalent to Xyzal (levocetirizine); these are listed in order from weakest to strongest. Generic, and therefore cheaper, options are in the parentheses.   Flonase (fluticasone); nasal spray that is over the counter. 2 sprays each nostril, once daily. Aim towards the same side eye when you spray.  There are available OTC, and the generic versions, which may be cheaper, are in parentheses. Show this to a pharmacist if you have trouble finding any of these items.  Let us know if you need anything.

## 2021-05-07 ENCOUNTER — Other Ambulatory Visit: Payer: Self-pay | Admitting: Family Medicine

## 2021-05-07 DIAGNOSIS — J309 Allergic rhinitis, unspecified: Secondary | ICD-10-CM

## 2021-05-08 ENCOUNTER — Other Ambulatory Visit: Payer: Self-pay

## 2021-05-08 ENCOUNTER — Encounter: Payer: Self-pay | Admitting: Family Medicine

## 2021-05-08 ENCOUNTER — Ambulatory Visit (INDEPENDENT_AMBULATORY_CARE_PROVIDER_SITE_OTHER): Payer: No Typology Code available for payment source | Admitting: Family Medicine

## 2021-05-08 VITALS — BP 108/64 | HR 84 | Temp 98.2°F | Ht 62.0 in | Wt 130.1 lb

## 2021-05-08 DIAGNOSIS — Z0000 Encounter for general adult medical examination without abnormal findings: Secondary | ICD-10-CM

## 2021-05-08 DIAGNOSIS — E782 Mixed hyperlipidemia: Secondary | ICD-10-CM

## 2021-05-08 MED ORDER — BUPROPION HCL ER (XL) 300 MG PO TB24: 300.0000 mg | ORAL_TABLET | Freq: Every day | ORAL | 2 refills | Status: DC

## 2021-05-08 NOTE — Progress Notes (Signed)
Chief Complaint  Patient presents with   Annual Exam     Well Woman Ariana Mcfarland is here for a complete physical.   Her last physical was >1 year ago.  Current diet: in general, a "healthy" diet. Current exercise: none. Weight is stable and she denies daytime fatigue. Seatbelt? Yes  Health Maintenance Colonoscopy- Yes Shingrix- No DEXA- Yes Mammogram- Yes Tetanus- Yes Pneumonia- Due for PCV20 Hep C screen- Yes  Past Medical History:  Diagnosis Date   Anxiety associated with depression    History of melanoma excision    Vaginal Pap smear, abnormal      Past Surgical History:  Procedure Laterality Date   BASAL CELL CARCINOMA EXCISION     x 3    MELANOMA EXCISION  2004    Medications  Current Outpatient Medications on File Prior to Visit  Medication Sig Dispense Refill   albuterol (VENTOLIN HFA) 108 (90 Base) MCG/ACT inhaler TAKE 2 PUFFS BY MOUTH EVERY 6 HOURS AS NEEDED FOR WHEEZE OR SHORTNESS OF BREATH 8.5 each 0   Ascorbic Acid (VITAMIN C PO) Take 1,000 mg by mouth daily.      aspirin EC 81 MG tablet Take 81 mg by mouth daily. Swallow whole.     buPROPion (WELLBUTRIN XL) 300 MG 24 hr tablet TAKE 1 TABLET BY MOUTH EVERY DAY 90 tablet 1   CALCIUM PO Take by mouth daily.     Cholecalciferol (VITAMIN D PO) Take by mouth.     fluticasone (FLONASE) 50 MCG/ACT nasal spray Place 2 sprays into both nostrils daily. 16 g 2   levocetirizine (XYZAL) 5 MG tablet Take 1 tablet (5 mg total) by mouth every evening. 30 tablet 2   RESVERATROL PO Take by mouth daily.     Allergies Allergies  Allergen Reactions   Elemental Sulfur    Sulfa Antibiotics     Headache    Review of Systems: Constitutional:  no fevers Eye:  no recent significant change in vision Ears:  No changes in hearing Nose/Mouth/Throat:  no complaints of nasal congestion, no sore throat Cardiovascular: no chest pain Respiratory:  No shortness of breath Gastrointestinal:  No change in bowel habits GU:  Female:  negative for dysuria Integumentary:  no abnormal skin lesions reported Neurologic:  no headaches Endocrine:  denies unexplained weight changes  Exam BP 108/64   Pulse 84   Temp 98.2 F (36.8 C) (Oral)   Ht 5\' 2"  (1.575 m)   Wt 130 lb 2 oz (59 kg)   SpO2 94%   BMI 23.80 kg/m  General:  well developed, well nourished, in no apparent distress Skin:  no significant moles, warts, or growths Head:  no masses, lesions, or tenderness Eyes:  pupils equal and round, sclera anicteric without injection Ears:  canals without lesions, TMs shiny without retraction, no obvious effusion, no erythema Nose:  nares patent, septum midline, mucosa normal, and no drainage or sinus tenderness Throat/Pharynx:  lips and gingiva without lesion; tongue and uvula midline; non-inflamed pharynx; no exudates or postnasal drainage Neck: neck supple without adenopathy, thyromegaly, or masses Lungs:  clear to auscultation, breath sounds equal bilaterally, no respiratory distress Cardio:  regular rate and rhythm, no bruits or LE edema Abdomen:  abdomen soft, nontender; bowel sounds normal; no masses or organomegaly Genital: Deferred Neuro:  gait normal; deep tendon reflexes normal and symmetric Psych: well oriented with normal range of affect and appropriate judgment/insight  Assessment and Plan  Well adult exam  Mixed hyperlipidemia - Plan:  Lipid panel   Well 75 y.o. female. Counseled on diet and exercise. Other orders as above. Shingrix, PCV20, covid bivalent booster rec'd, would like to get over bronchitis fully before taking.  Follow up in 6 mo. 2 mo for fasting labs at [facility]'s were elevated before.  The patient voiced understanding and agreement to the plan.  [place], DO 05/08/21 2:15 PM

## 2021-05-08 NOTE — Patient Instructions (Addendum)
Give [place] 2-3 business days to get the results of your labs back.   Keep the diet clean and stay active.  The new Shingrix vaccine (for shingles) is a 2 shot series. It can make people feel low energy, achy and almost like they have the flu for 48 hours after injection. Please plan accordingly when deciding on when to get this shot. Call our office for a nurse visit appointment to get this. The second shot of the series is less severe regarding the side effects, but it still lasts 48 hours.   I recommend getting the updated bivalent covid vaccination booster at your convenience.   I recommend the pneumonia vaccine at your convenience as well.   Let us know if you need anything.

## 2021-05-11 ENCOUNTER — Ambulatory Visit (HOSPITAL_BASED_OUTPATIENT_CLINIC_OR_DEPARTMENT_OTHER): Payer: No Typology Code available for payment source

## 2021-05-18 ENCOUNTER — Ambulatory Visit (HOSPITAL_BASED_OUTPATIENT_CLINIC_OR_DEPARTMENT_OTHER): Payer: No Typology Code available for payment source

## 2021-06-08 ENCOUNTER — Encounter (HOSPITAL_BASED_OUTPATIENT_CLINIC_OR_DEPARTMENT_OTHER): Payer: Self-pay

## 2021-06-08 ENCOUNTER — Other Ambulatory Visit: Payer: Self-pay

## 2021-06-08 ENCOUNTER — Ambulatory Visit (HOSPITAL_BASED_OUTPATIENT_CLINIC_OR_DEPARTMENT_OTHER)
Admission: RE | Admit: 2021-06-08 | Discharge: 2021-06-08 | Disposition: A | Discharge status: Home / Self Care | Payer: No Typology Code available for payment source | Source: Ambulatory Visit | Attending: Family Medicine | Admitting: Family Medicine

## 2021-06-08 DIAGNOSIS — Z1231 Encounter for screening mammogram for malignant neoplasm of breast: Secondary | ICD-10-CM

## 2021-06-15 ENCOUNTER — Telehealth (INDEPENDENT_AMBULATORY_CARE_PROVIDER_SITE_OTHER): Payer: No Typology Code available for payment source | Admitting: Family Medicine

## 2021-06-15 ENCOUNTER — Encounter: Payer: Self-pay | Admitting: Family Medicine

## 2021-06-15 ENCOUNTER — Telehealth: Payer: Self-pay

## 2021-06-15 DIAGNOSIS — U071 COVID-19: Secondary | ICD-10-CM | POA: Diagnosis not present

## 2021-06-15 MED ORDER — BENZONATATE 200 MG PO CAPS: 200.0000 mg | ORAL_CAPSULE | Freq: Two times a day (BID) | ORAL | 0 refills | Status: DC | PRN

## 2021-06-15 MED ORDER — MOLNUPIRAVIR EUA 200MG CAPSULE: 4.0000 | ORAL_CAPSULE | Freq: Two times a day (BID) | ORAL | 0 refills | Status: AC

## 2021-06-15 NOTE — Telephone Encounter (Signed)
Where do you want her to be scheduled.

## 2021-06-15 NOTE — Progress Notes (Signed)
Chief Complaint  Patient presents with   Covid Positive    Ariana Mcfarland here for URI complaints. Due to COVID-19 pandemic, we are interacting via web portal for an electronic face-to-face visit. I verified patient's ID using 2 identifiers. Patient agreed to proceed with visit via this method. Patient is at home, I am at office. Patient and I are present for visit.   Duration: 4 days  Associated symptoms: sinus congestion, sinus pain, sore throat, chest tightness, and coughing, loss of taste/smell, fatigue Denies: rhinorrhea, itchy watery eyes, ear pain, ear drainage, wheezing, shortness of breath, myalgia, and fevers, N/V/D Treatment to date: Excedrin, Alka Seltzer night Sick contacts: No Tested + for covid on 12/16.  Had first series for vaccination. No boosters.   Past Medical History:  Diagnosis Date   Anxiety associated with depression    History of melanoma excision    Vaginal Pap smear, abnormal     Objective No conversational dyspnea Age appropriate judgment and insight Nml affect and mood  COVID-19 - Plan: benzonatate (TESSALON) 200 MG capsule, molnupiravir EUA (LAGEVRIO) 200 mg CAPS capsule  Continue to push fluids, practice good hand hygiene, cover mouth when coughing. Discussed quarantining recommendations.  F/u prn. If starting to experience irreplaceable fluid loss, shaking, or shortness of breath, seek immediate care. Pt voiced understanding and agreement to the plan.  [place], DO 06/15/21 11:56 AM

## 2021-06-15 NOTE — Telephone Encounter (Signed)
Scheduled at noon with the patient

## 2021-06-15 NOTE — Telephone Encounter (Signed)
urse Assessment Nurse: Verita Schneiders, RN, April Date/Time Eilene Ghazi Time): 06/13/2021 3:14:59 PM Confirm and document reason for call. If symptomatic, describe symptoms. ---The caller states that she tested positive for COVID today and is currently experiencing body aches, diarrhea, dry cough, runny nose, and general weakness. No fever. Symptoms began yesterday morning. Confirmed home test with [facility] rapid test. Does the patient have any new or worsening symptoms? ---Yes Will a triage be completed? ---Yes Related visit to physician within the last 2 weeks? ---Yes Does the PT have any chronic conditions? (i.e. diabetes, asthma, this includes High risk factors for pregnancy, etc.) ---Yes List chronic conditions. ---asthma Is this a behavioral health or substance abuse call? ---No Guidelines Guideline Title Affirmed Question Affirmed Notes Nurse Date/Time (Eastern Time) COVID-19 - Diagnosed or Suspected [1] HIGH RISK for severe COVID complications (e.g., weak immune system, age > 48 years, obesity with BMI 30 or higher, Beams, RN, April 06/13/2021 3:17:31 PM PLEASE NOTE: All timestamps contained within this report are represented as [place] Standard Time. CONFIDENTIALTY NOTICE: This fax transmission is intended only for the addressee. It contains information that is legally privileged, confidential or otherwise protected from use or disclosure. If you are not the intended recipient, you are strictly prohibited from reviewing, disclosing, copying using or disseminating any of this information or taking any action in reliance on or regarding this information. If you have received this fax in error, please notify us immediately by telephone so that we can arrange for its return to [place]. Phone: [phone], Toll-Free: [phone], Fax: [phone] Page: 2 of 2 Call Id: [id number] Guidelines Guideline Title Affirmed Question Affirmed Notes Nurse Date/Time Eilene Ghazi Time) pregnant,  chronic lung disease or other chronic medical condition) AND [2] COVID symptoms (e.g., cough, fever) (Exceptions: Already seen by PCP and no new or worsening symptoms.) Disp. Time Eilene Ghazi Time) Disposition Final User 06/13/2021 3:23:54 PM Call PCP within 24 Hours Yes Beams, RN, April Caller Disagree/Comply Comply Caller Understands Yes PreDisposition Call Doctor Care Advice Given Per Guideline CALL PCP WITHIN 24 HOURS: * You need to discuss this with your doctor (or NP/PA) within the next 24 hours. * IF OFFICE WILL BE CLOSED: I'll page the on-call provider now. EXCEPTION: from 9 pm to 9 am. Since this isn't urgent, we'll hold the page until morning. GENERAL CARE ADVICE FOR COVID-19 SYMPTOMS: * The symptoms are generally treated the same whether you have COVID-19, influenza or some other respiratory virus. * COUGH SYRUP WITH DEXTROMETHORPHAN: An over-the-counter cough syrup can help your cough. The most common cough suppressant in over-the-counter cough medicines is dextromethorphan. COUGH MEDICINES: * COUGH DROPS: Over-the-counter cough drops can help a lot, especially for mild coughs. They soothe an irritated throat and remove the tickle sensation in the back of the throat. Cough drops are easy to carry with you. * HOME REMEDY - HARD CANDY: Hard candy works just as well as over-the-counter cough drops. People who have diabetes should use sugar-free candy. COUGH SYRUP WITH DEXTROMETHORPHAN: * Cough syrups containing the cough suppressant dextromethorphan may help decrease your cough. * STAY HOME A MINIMUM OF 5 DAYS: People with MILD COVID-19 can STOP HOME ISOLATION AFTER 5 DAYS if (1) fever has been gone for 24 hours (without using fever medicine) AND (2) symptoms are better. Continue to wear a well-fitted mask for a full 10 days when around others. COVID-19 - HOW TO PROTECT OTHERS - WHEN YOU ARE SICK WITH COVID-19: * WEAR A MASK FOR 10 DAYS: Wear a well-fitted mask for 10 full  days  any time you are around others inside your home or in public. Do not go to places where you are unable to wear a mask. CALL BACK IF: * You become worse CARE ADVICE given per COVID-19 - DIAGNOSED OR SUSPECTED (Adult) guideline. Referrals REFERRED TO PCP OFFICE

## 2021-06-16 ENCOUNTER — Telehealth: Payer: No Typology Code available for payment source | Admitting: Family

## 2021-07-13 ENCOUNTER — Other Ambulatory Visit (INDEPENDENT_AMBULATORY_CARE_PROVIDER_SITE_OTHER): Payer: No Typology Code available for payment source

## 2021-07-13 DIAGNOSIS — E782 Mixed hyperlipidemia: Secondary | ICD-10-CM

## 2021-07-13 LAB — LIPID PANEL
Cholesterol: 213 mg/dL — ABNORMAL HIGH (ref 0–200)
HDL: 59.7 mg/dL (ref >39.00)
LDL Cholesterol: 124 mg/dL — ABNORMAL HIGH (ref 0–99)
NonHDL: 153.76
Total CHOL/HDL Ratio: 4
Triglycerides: 147 mg/dL (ref 0.0–149.0)
VLDL: 29.4 mg/dL (ref 0.0–40.0)

## 2021-07-16 ENCOUNTER — Other Ambulatory Visit: Payer: Self-pay

## 2021-07-16 ENCOUNTER — Ambulatory Visit (INDEPENDENT_AMBULATORY_CARE_PROVIDER_SITE_OTHER): Payer: No Typology Code available for payment source | Admitting: Family Medicine

## 2021-07-16 ENCOUNTER — Encounter: Payer: Self-pay | Admitting: Family Medicine

## 2021-07-16 VITALS — BP 131/61 | HR 86 | Ht 63.0 in | Wt 129.0 lb

## 2021-07-16 DIAGNOSIS — Z01419 Encounter for gynecological examination (general) (routine) without abnormal findings: Secondary | ICD-10-CM

## 2021-07-16 NOTE — Progress Notes (Signed)
GYNECOLOGY ANNUAL PREVENTATIVE CARE ENCOUNTER NOTE  Subjective:   Ariana Mcfarland is a 76 y.o. [id number] female here for a routine annual gynecologic exam.  Current complaints: none.   Denies abnormal vaginal bleeding, discharge, pelvic pain, problems with intercourse or other gynecologic concerns.    Gynecologic History No LMP recorded. Patient is postmenopausal. Patient is not sexually active  Contraception: none Last Pap: older than 80. Last mammogram: 12/22. Results were: birads 1   Obstetric History OB History  Gravida Para Term Preterm AB Living  2 2 2  0 0 2  SAB IAB Ectopic Multiple Live Births  0 0 0 0 2    # Outcome Date GA Lbr Len/2nd Weight Sex Delivery Anes PTL Lv  2 Term      Vag-Spont     1 Term      Vag-Spont       Past Medical History:  Diagnosis Date   Anxiety associated with depression    History of melanoma excision    Vaginal Pap smear, abnormal     Past Surgical History:  Procedure Laterality Date   BASAL CELL CARCINOMA EXCISION     x 3    MELANOMA EXCISION  2004    Current Outpatient Medications on File Prior to Visit  Medication Sig Dispense Refill   albuterol (VENTOLIN HFA) 108 (90 Base) MCG/ACT inhaler TAKE 2 PUFFS BY MOUTH EVERY 6 HOURS AS NEEDED FOR WHEEZE OR SHORTNESS OF BREATH 8.5 each 0   Ascorbic Acid (VITAMIN C PO) Take 1,000 mg by mouth daily.      aspirin EC 81 MG tablet Take 81 mg by mouth daily. Swallow whole.     buPROPion (WELLBUTRIN XL) 300 MG 24 hr tablet Take 1 tablet (300 mg total) by mouth daily. 90 tablet 2   CALCIUM PO Take by mouth daily.     Cholecalciferol (VITAMIN D PO) Take by mouth.     fluticasone (FLONASE) 50 MCG/ACT nasal spray Place 2 sprays into both nostrils daily. 16 g 2   RESVERATROL PO Take by mouth daily.     benzonatate (TESSALON) 200 MG capsule Take 1 capsule (200 mg total) by mouth 2 (two) times daily as needed for cough. 20 capsule 0   levocetirizine (XYZAL) 5 MG tablet Take 1 tablet (5 mg total) by  mouth every evening. 30 tablet 2   No current facility-administered medications on file prior to visit.    Allergies  Allergen Reactions   Elemental Sulfur    Sulfa Antibiotics     Headache    Social History   Socioeconomic History   Marital status: Married    Spouse name: Not on file   Number of children: Not on file   Years of education: Not on file   Highest education level: Not on file  Occupational History   Not on file  Tobacco Use   Smoking status: Former    Types: Cigarettes    Quit date: 10/13/2002    Years since quitting: 18.7   Smokeless tobacco: Never  Substance and Sexual Activity   Alcohol use: Yes    Alcohol/week: 2.0 standard drinks    Types: 2 Standard drinks or equivalent per week   Drug use: No   Sexual activity: Never  Other Topics Concern   Not on file  Social History Narrative   Not on file   Social Determinants of Health   Financial Resource Strain: Not on file  Food Insecurity: Not on file  Transportation  Needs: Not on file  Physical Activity: Not on file  Stress: Not on file  Social Connections: Not on file  Intimate Partner Violence: Not on file    Family History  Problem Relation Age of Onset   Diabetes Mother    Heart disease Mother    Diabetes Father    Heart disease Father     The following portions of the patient's history were reviewed and updated as appropriate: allergies, current medications, past family history, past medical history, past social history, past surgical history and problem list.  Review of Systems Pertinent items are noted in HPI.   Objective:  BP 131/61    Pulse 86    Ht 5\' 3"  (1.6 m)    Wt 129 lb (58.5 kg)    BMI 22.85 kg/m  Wt Readings from Last 3 Encounters:  07/16/21 129 lb (58.5 kg)  05/08/21 130 lb 2 oz (59 kg)  04/15/21 128 lb 8 oz (58.3 kg)     Chaperone present during exam  CONSTITUTIONAL: Well-developed, well-nourished female in no acute distress.  HENT:  Normocephalic, atraumatic,  External right and left ear normal. Oropharynx is clear and moist EYES: Conjunctivae and EOM are normal. Pupils are equal, round, and reactive to light. No scleral icterus.  NECK: Normal range of motion, supple, no masses.  Normal thyroid.   CARDIOVASCULAR: Normal heart rate noted, regular rhythm RESPIRATORY: Clear to auscultation bilaterally. Effort and breath sounds normal, no problems with respiration noted. BREASTS: Symmetric in size. No masses, skin changes, nipple drainage, or lymphadenopathy. ABDOMEN: Soft, normal bowel sounds, no distention noted.  No tenderness, rebound or guarding.  PELVIC: Normal appearing external genitalia; atrophic appearing vaginal mucosa and cervix. Mild cystocele. No abnormal discharge noted.  Normal uterine size, no other palpable masses, no uterine or adnexal tenderness. MUSCULOSKELETAL: Normal range of motion. No tenderness.  No cyanosis, clubbing, or edema.  2+ distal pulses. SKIN: Skin is warm and dry. No rash noted. Not diaphoretic. No erythema. No pallor. NEUROLOGIC: Alert and oriented to person, place, and time. Normal reflexes, muscle tone coordination. No cranial nerve deficit noted. PSYCHIATRIC: Normal mood and affect. Normal behavior. Normal judgment and thought content.  Assessment:  Annual gynecologic examination with pap smear   Plan:  1. Well Woman Exam As patient over 65, PAP not indicated Mammogram reviewed - Birads 1.   Routine preventative health maintenance measures emphasized. Please refer to After Visit Summary for other counseling recommendations.    Loma Boston, [place] for [employer]

## 2021-11-09 ENCOUNTER — Ambulatory Visit: Payer: No Typology Code available for payment source | Admitting: Family Medicine

## 2021-12-09 ENCOUNTER — Ambulatory Visit: Payer: No Typology Code available for payment source | Admitting: Family Medicine

## 2021-12-09 ENCOUNTER — Encounter: Payer: Self-pay | Admitting: Family Medicine

## 2021-12-09 VITALS — BP 112/70 | HR 78 | Temp 98.0°F | Ht 62.0 in | Wt 132.2 lb

## 2021-12-09 DIAGNOSIS — R1319 Other dysphagia: Secondary | ICD-10-CM | POA: Diagnosis not present

## 2021-12-09 DIAGNOSIS — K219 Gastro-esophageal reflux disease without esophagitis: Secondary | ICD-10-CM

## 2021-12-09 MED ORDER — PANTOPRAZOLE SODIUM 40 MG PO TBEC: 40.0000 mg | DELAYED_RELEASE_TABLET | Freq: Every day | ORAL | 1 refills | Status: DC

## 2021-12-09 NOTE — Progress Notes (Signed)
Chief Complaint  Patient presents with   Cough    Ariana Mcfarland here for URI complaints.  Duration: 2 weeks  Associated symptoms: sinus congestion, rhinorrhea, and cough, loss of voice; symptoms are improving Denies: sinus pain, itchy watery eyes, ear pain, ear drainage, sore throat, wheezing, shortness of breath, myalgia, and fevers Treatment to date: OTC cold medications Sick contacts: No  GERD Patient has a several year history of reflux which was normally controlled with Tums.  Over the past several months, things have been getting worse with her heartburn symptoms.  She also has associated difficulty swallowing feeling food gets stuck in her chest.  She has no difficulty swallowing liquids.  She denies any weight loss, bleeding, nighttime awakenings, or abdominal pain.  She has never tried any medicine beyond Tums.  Past Medical History:  Diagnosis Date   Anxiety associated with depression    History of melanoma excision    Vaginal Pap smear, abnormal     Objective BP 112/70   Pulse 78   Temp 98 F (36.7 C) (Oral)   Ht '5\' 2"'$  (1.575 m)   Wt 132 lb 4 oz (60 kg)   SpO2 99%   BMI 24.19 kg/m  General: Awake, alert, appears stated age HEENT: AT, [place], ears patent b/l and TM's neg, nares patent w/o discharge, pharynx pink and without exudates, MMM Neck: No masses or asymmetry Heart: RRR Lungs: CTAB, no accessory muscle use Abdomen: Bowel sounds present, soft, nontender, nondistended Psych: Age appropriate judgment and insight, normal mood and affect  Esophageal dysphagia - Plan: Ambulatory referral to Gastroenterology  Gastroesophageal reflux disease, unspecified whether esophagitis present - Plan: pantoprazole (PROTONIX) 40 MG tablet  New problem, needs to see gastroenterology.  Will refer back to Dr. Arlana Pouch who she has seen in the past but it has been nearly 10 years. Chronic, not controlled.  Reflux precautions discussed and written down.  Start Pepcid 20 mg twice daily for  the next 2 weeks.  If no improvement she will start Protonix to 40 mg daily. Acute, resolving on own.  Continue supportive care. Follow-up as originally scheduled. Pt voiced understanding and agreement to the plan.  [place], DO 12/09/21 2:15 PM

## 2021-12-09 NOTE — Patient Instructions (Addendum)
If you do not hear anything about your referral in the next 1-2 weeks, call our office and ask for an update.  The only lifestyle changes that have data behind them are weight loss for the overweight/obese and elevating the head of the bed. Finding out which foods/positions are triggers is important.  XR from 2022 is normal.   Pepcid (famotidine) 20 mg 1-2 times daily for the next 10-14 days. If no improvement, take the Protonix.   Let us know if you need anything.

## 2022-01-01 ENCOUNTER — Other Ambulatory Visit: Payer: Self-pay | Admitting: Family Medicine

## 2022-01-01 DIAGNOSIS — K219 Gastro-esophageal reflux disease without esophagitis: Secondary | ICD-10-CM

## 2022-02-03 ENCOUNTER — Other Ambulatory Visit: Payer: Self-pay | Admitting: Family Medicine

## 2022-02-03 DIAGNOSIS — K219 Gastro-esophageal reflux disease without esophagitis: Secondary | ICD-10-CM

## 2022-02-08 ENCOUNTER — Other Ambulatory Visit: Payer: Self-pay | Admitting: Family Medicine

## 2022-02-08 DIAGNOSIS — K219 Gastro-esophageal reflux disease without esophagitis: Secondary | ICD-10-CM

## 2022-03-26 ENCOUNTER — Other Ambulatory Visit: Payer: Self-pay | Admitting: Family Medicine

## 2022-04-03 IMAGING — MG MM DIGITAL SCREENING BILAT W/ TOMO AND CAD
8 series · 8 of 24 positions shown · non-contrast
Comparison: Previous exam(s).

CLINICAL DATA: Screening.

EXAM:
DIGITAL SCREENING BILATERAL MAMMOGRAM WITH TOMOSYNTHESIS AND CAD
TECHNIQUE: Bilateral screening digital craniocaudal and mediolateral oblique
mammograms were obtained. Bilateral screening digital breast
tomosynthesis was performed. The images were evaluated with
computer-aided detection.

[L MLO synth-2D]
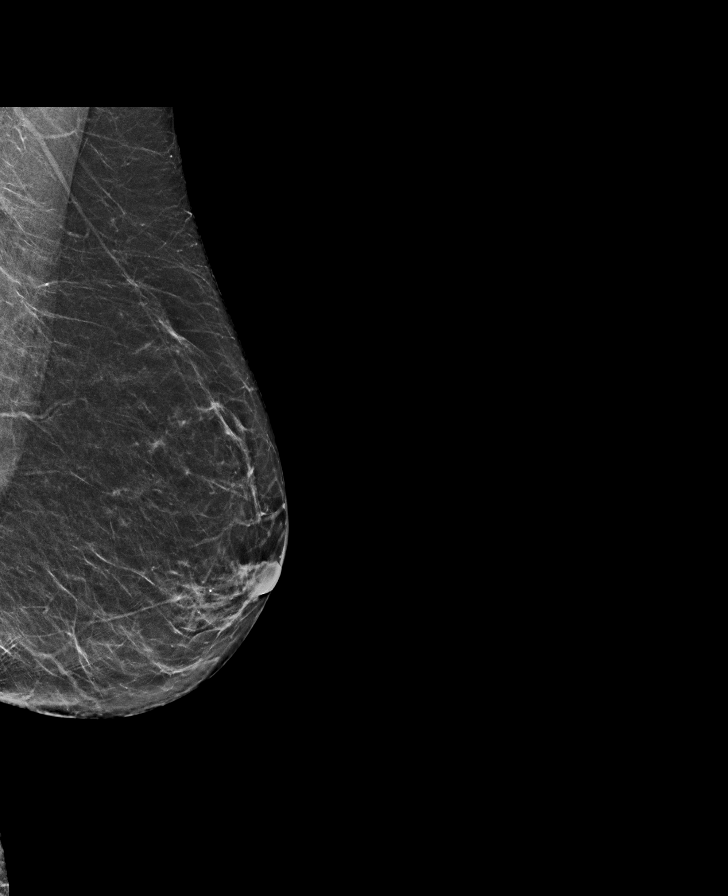

[R MLO synth-2D]
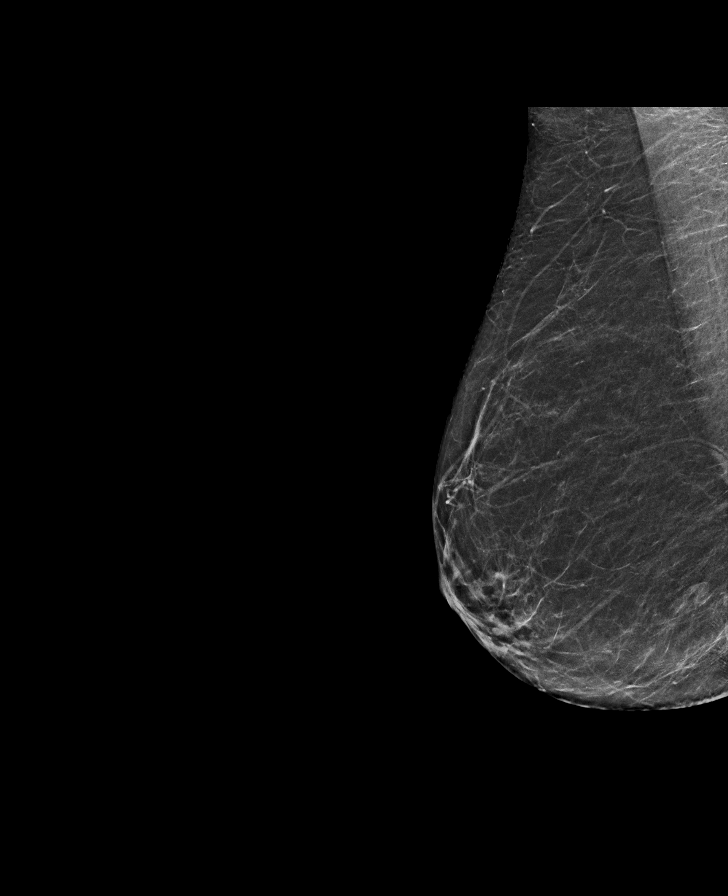

[R CC synth-2D]
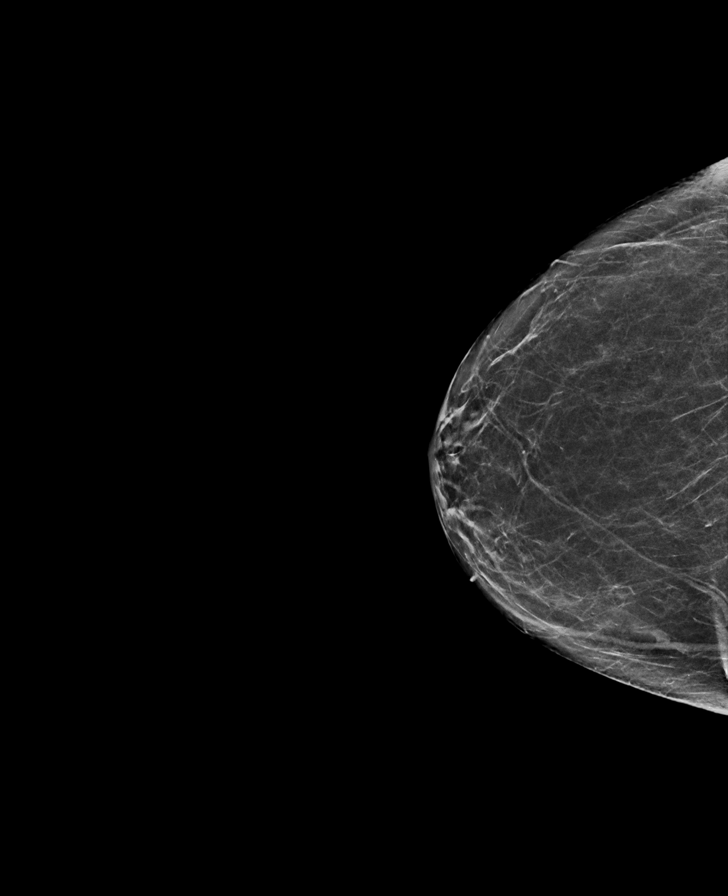

[L CC synth-2D]
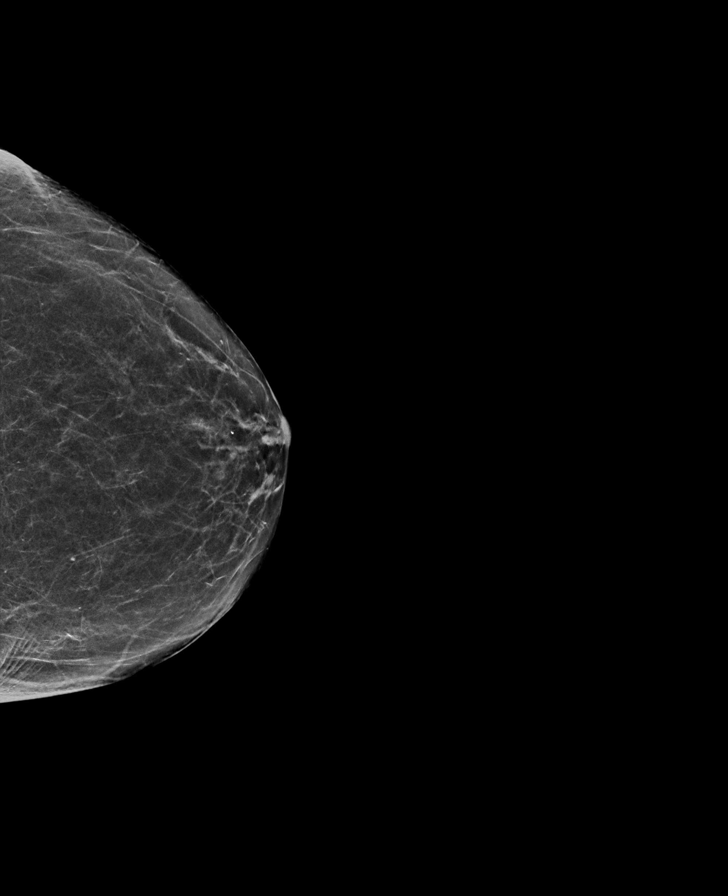

[L MLO tomo · tomo slice 29/57.0]
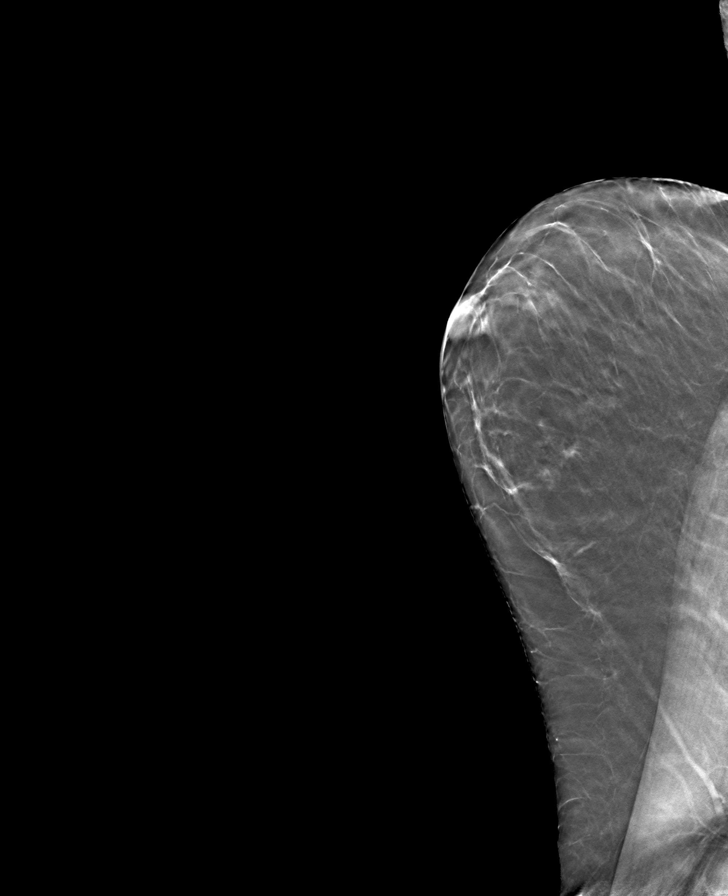

[L CC tomo · tomo slice 29/56.0]
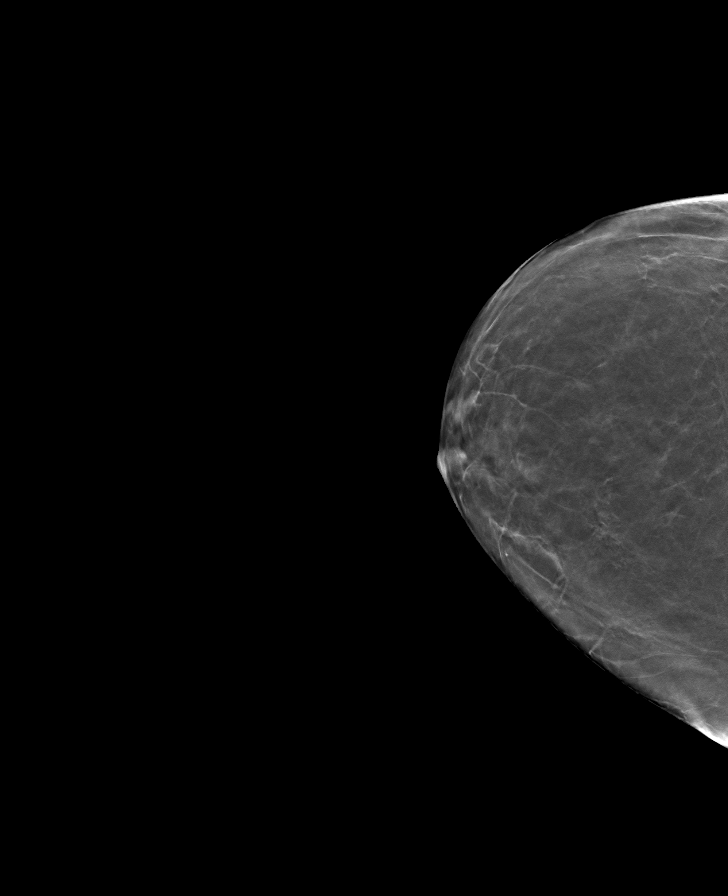

[R CC tomo · tomo slice 28/55.0]
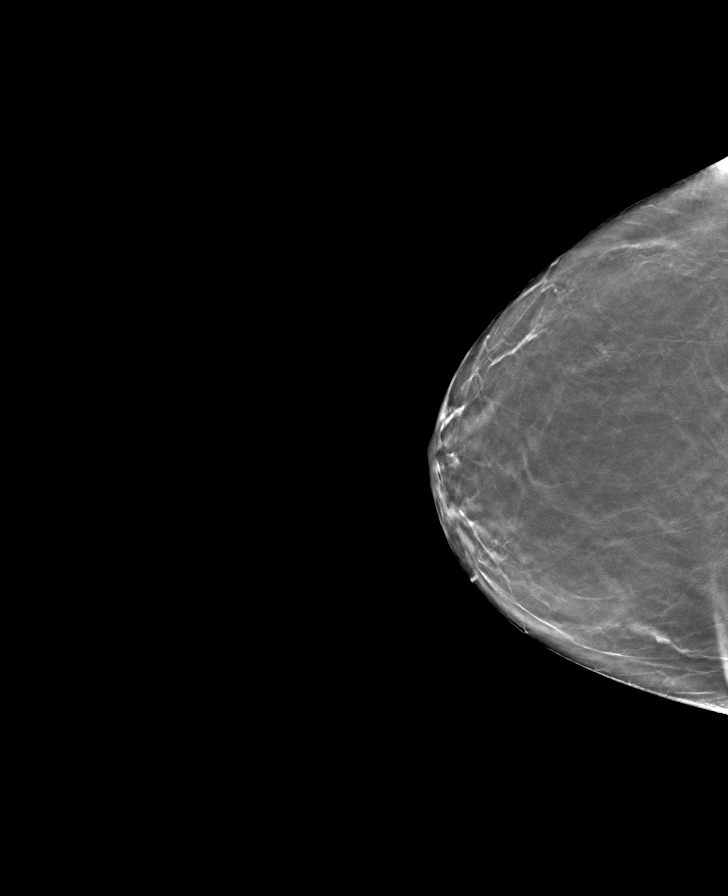

[R MLO tomo · tomo slice 29/56.0]
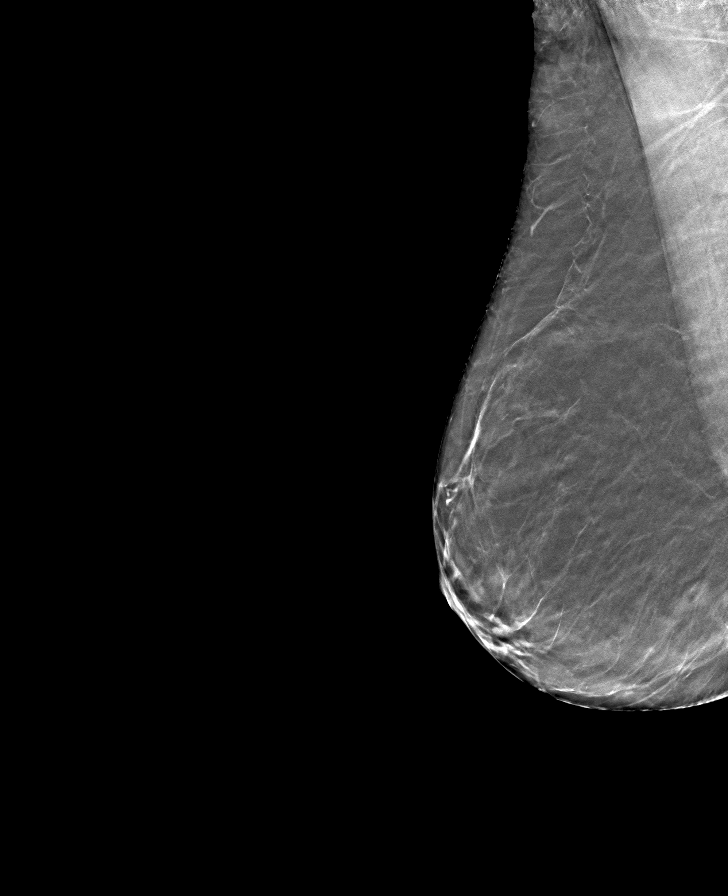

[8 of 24 positions shown; findings below may reference images not displayed]

ACR Breast Density Category b: There are scattered areas of
fibroglandular density.
FINDINGS: There are no findings suspicious for malignancy.
IMPRESSION: No mammographic evidence of malignancy. A result letter of this
screening mammogram will be mailed directly to the patient.

RECOMMENDATION:
Screening mammogram in one year. (Code:51-O-LD2)

BI-RADS CATEGORY  1: Negative.

## 2022-05-12 ENCOUNTER — Encounter: Payer: Self-pay | Admitting: General Practice

## 2022-05-26 ENCOUNTER — Telehealth: Payer: Self-pay

## 2022-05-26 ENCOUNTER — Ambulatory Visit: Payer: No Typology Code available for payment source | Admitting: Family Medicine

## 2022-05-26 ENCOUNTER — Encounter: Payer: Self-pay | Admitting: Family Medicine

## 2022-05-26 ENCOUNTER — Ambulatory Visit (INDEPENDENT_AMBULATORY_CARE_PROVIDER_SITE_OTHER): Payer: No Typology Code available for payment source

## 2022-05-26 VITALS — BP 122/70 | HR 78 | Temp 98.5°F | Resp 16 | Ht 62.0 in | Wt 131.0 lb

## 2022-05-26 DIAGNOSIS — M545 Low back pain, unspecified: Secondary | ICD-10-CM

## 2022-05-26 DIAGNOSIS — R110 Nausea: Secondary | ICD-10-CM

## 2022-05-26 DIAGNOSIS — R109 Unspecified abdominal pain: Secondary | ICD-10-CM | POA: Diagnosis not present

## 2022-05-26 LAB — BASIC METABOLIC PANEL
BUN: 12 mg/dL (ref 6–23)
CO2: 27 mEq/L (ref 19–32)
Calcium: 8.7 mg/dL (ref 8.4–10.5)
Chloride: 106 mEq/L (ref 96–112)
Creatinine, Ser: 0.69 mg/dL (ref 0.40–1.20)
GFR: 85.14 mL/min (ref >60.00)
Glucose, Bld: 95 mg/dL (ref 70–99)
Potassium: 3.9 mEq/L (ref 3.5–5.1)
Sodium: 140 mEq/L (ref 135–145)

## 2022-05-26 LAB — POCT URINALYSIS DIPSTICK
Bilirubin, UA: NEGATIVE
Blood, UA: POSITIVE
Glucose, UA: NEGATIVE
Ketones, UA: NEGATIVE
Leukocytes, UA: NEGATIVE
Nitrite, UA: NEGATIVE
Protein, UA: NEGATIVE
Spec Grav, UA: 1.025 (ref 1.010–1.025)
Urobilinogen, UA: 0.2 E.U./dL
pH, UA: 5 (ref 5.0–8.0)

## 2022-05-26 LAB — CBC
HCT: 37.5 % (ref 36.0–46.0)
Hemoglobin: 12.5 g/dL (ref 12.0–15.0)
MCHC: 33.3 g/dL (ref 30.0–36.0)
MCV: 88.6 fl (ref 78.0–100.0)
Platelets: 297 10*3/uL (ref 150.0–400.0)
RBC: 4.23 Mil/uL (ref 3.87–5.11)
RDW: 12.8 % (ref 11.5–15.5)
WBC: 6.8 10*3/uL (ref 4.0–10.5)

## 2022-05-26 MED ORDER — ONDANSETRON HCL 4 MG PO TABS: 4.0000 mg | ORAL_TABLET | Freq: Three times a day (TID) | ORAL | 0 refills | Status: AC | PRN

## 2022-05-26 MED ORDER — TRAMADOL HCL 50 MG PO TABS: 50.0000 mg | ORAL_TABLET | Freq: Two times a day (BID) | ORAL | 0 refills | Status: AC | PRN

## 2022-05-26 NOTE — Telephone Encounter (Signed)
Caller Name Cowen Phone Number [phone] Patient Name Ariana Mcfarland Patient DOB 23-Jan-1946 Call Type Message Only Information Provided Reason for Call Request to Schedule Office Appointment Initial Comment Caller states she is having problems with her back and low abdomen. Her left foot had a cramp last night. She would like to see someone today. Patient request to speak to RN No Additional Comment Office hours provided. Triage declined. Disp. Time Disposition Final User 05/26/2022 8:04:57 AM General Information Provided Yes Orrin Brigham Call Closed By: Orrin Brigham Transaction Date/Time: 05/26/2022 8:01:35 AM (ET)

## 2022-05-26 NOTE — Patient Instructions (Addendum)
A few things to remember from today's visit:  Abdominal pain, unspecified abdominal location - Plan: Basic metabolic panel, CBC, DG Abd 1 View, POCT urinalysis dipstick, Urine Culture  Acute left-sided low back pain, unspecified whether sciatica present - Plan: DG Abd 1 View, DG Lumbar Spine Complete, POCT urinalysis dipstick, Urine Culture, traMADol (ULTRAM) 50 MG tablet  Could be a kidney stone. You can also take Tylenol 500 mg 3-4 times per day and maybe Ibuprofen 400-600 mg with food 3 times per day if needed. Zofran for nausea. Monitor for new symptoms.  Please be sure medication list is accurate. If a new problem present, please set up appointment sooner than planned today.

## 2022-05-26 NOTE — Telephone Encounter (Signed)
Appt w/ Dr. Martinique

## 2022-05-26 NOTE — Progress Notes (Signed)
ACUTE VISIT Chief Complaint  Patient presents with   Back Pain    Started yesterday morning, no known injury, goes from lower back around to her stomach; has some bloating & nausea. Foot started cramping last night as well.   HPI: Ms.Ariana Mcfarland is a 76 y.o. female with medical hx significant for skin cancer, constipation,depression vaginal atrophy n, here today with her husband complaining of low back pain as described above, sudden onset while she was driving.  She reports that the pain started as a dull ache across her entire back but has become more intense today, particularly on the left side, radiating to left flank and LLQ. The pain is described as a 7 or 8 out of 10 in intensity.  Pain is constant and associated with nausea.  Back Pain This is a new problem. The current episode started yesterday. The problem has been gradually worsening since onset. The pain is present in the lumbar spine. The quality of the pain is described as aching. The pain is at a severity of 8/10. The pain is moderate. Stiffness is present All day. Associated symptoms include abdominal pain and tingling. Pertinent negatives include no bladder incontinence, bowel incontinence, chest pain, dysuria, fever, headaches, numbness, pelvic pain or weight loss. Risk factors include history of cancer. She has tried NSAIDs for the symptoms. The treatment provided no relief.  She denies any history of kidney stones or trauma.  She mentions mild constipation over the past two to three days, but states that this is not unusual for her. She denies fever but reports feeling chilled at times. She has noticed a slow urinary stream.  She has been taking ibuprofen for pain management.  She also reports experiencing a cramp in her left foot last night, which she has never experienced before. Also tingling sensation on pre-tibial area of her left lower extremity, right above ankle. She denies any associated leg pain,edema, or  erythema.  Review of Systems  Constitutional:  Negative for appetite change, fever and weight loss.  HENT:  Negative for mouth sores and sore throat.   Respiratory:  Negative for cough, shortness of breath and wheezing.   Cardiovascular:  Negative for chest pain.  Gastrointestinal:  Positive for abdominal pain. Negative for bowel incontinence and vomiting.  Genitourinary:  Negative for bladder incontinence, dysuria and pelvic pain.  Musculoskeletal:  Positive for back pain.  Skin:  Negative for rash.  Neurological:  Positive for tingling. Negative for syncope, numbness and headaches.  See other pertinent positives and negatives in HPI.  Current Outpatient Medications on File Prior to Visit  Medication Sig Dispense Refill   Ascorbic Acid (VITAMIN C PO) Take 1,000 mg by mouth daily.      Ascorbic Acid (VITAMIN C) 1000 MG tablet Take 1,000 mg by mouth daily.     buPROPion (WELLBUTRIN XL) 300 MG 24 hr tablet TAKE 1 TABLET BY MOUTH EVERY DAY 90 tablet 2   CALCIUM PO Take by mouth daily.     Cholecalciferol (VITAMIN D PO) Take by mouth.     pantoprazole (PROTONIX) 40 MG tablet TAKE 1 TABLET BY MOUTH EVERY DAY 90 tablet 1   RESVERATROL PO Take by mouth daily.     No current facility-administered medications on file prior to visit.   Past Medical History:  Diagnosis Date   Anxiety associated with depression    History of melanoma excision    Vaginal Pap smear, abnormal    Allergies  Allergen Reactions   Elemental  Sulfur    Sulfa Antibiotics     Headache    Social History   Socioeconomic History   Marital status: Married    Spouse name: Not on file   Number of children: Not on file   Years of education: Not on file   Highest education level: Not on file  Occupational History   Not on file  Tobacco Use   Smoking status: Former    Types: Cigarettes    Quit date: 10/13/2002    Years since quitting: 19.6   Smokeless tobacco: Never  Substance and Sexual Activity   Alcohol  use: Yes    Alcohol/week: 2.0 standard drinks of alcohol    Types: 2 Standard drinks or equivalent per week   Drug use: No   Sexual activity: Never  Other Topics Concern   Not on file  Social History Narrative   Not on file   Social Determinants of Health   Financial Resource Strain: Not on file  Food Insecurity: Not on file  Transportation Needs: Not on file  Physical Activity: Not on file  Stress: Not on file  Social Connections: Not on file   Vitals:   05/26/22 1347  BP: 122/70  Pulse: 78  Resp: 16  Temp: 98.5 F (36.9 C)  SpO2: 99%   Body mass index is 23.96 kg/m.  Physical Exam Vitals and nursing note reviewed.  Constitutional:      General: She is not in acute distress.    Appearance: She is well-developed.  HENT:     Head: Normocephalic and atraumatic.  Eyes:     Conjunctiva/sclera: Conjunctivae normal.  Cardiovascular:     Rate and Rhythm: Normal rate and regular rhythm.     Pulses:          Dorsalis pedis pulses are 2+ on the right side and 2+ on the left side.     Heart sounds: No murmur heard. Pulmonary:     Effort: Pulmonary effort is normal. No respiratory distress.     Breath sounds: Normal breath sounds.  Abdominal:     Palpations: Abdomen is soft. There is no mass.     Tenderness: There is abdominal tenderness in the left upper quadrant and left lower quadrant. There is no right CVA tenderness or left CVA tenderness.  Skin:    General: Skin is warm.     Findings: No erythema or rash.  Neurological:     General: No focal deficit present.     Mental Status: She is alert and oriented to person, place, and time.     Gait: Gait normal.  Psychiatric:        Mood and Affect: Mood and affect normal.   ASSESSMENT AND PLAN:  Ariana Mcfarland is a 76 yo seen today for back pain and nausea. Lab Results  Component Value Date   CREATININE 0.69 05/26/2022   BUN 12 05/26/2022   NA 140 05/26/2022   K 3.9 05/26/2022   CL 106 05/26/2022   CO2 27  05/26/2022   Lab Results  Component Value Date   WBC 6.8 05/26/2022   HGB 12.5 05/26/2022   HCT 37.5 05/26/2022   MCV 88.6 05/26/2022   PLT 297.0 05/26/2022   Acute left-sided low back pain, unspecified whether sciatica present We discussed possible etiologies.? Musculoskeletal,nephrolithiasis. Because new onset, lumbar X ray will be obtained. In regard to pain management, she can continue Ibuprofen and Tylenol. Tramadol 50 mg bid prn.side effects of meds discussed.  Left lower  extremity tingling sensation could be radicular, if persistent for 4-6 weeks may need lumbar MRI.  -     DG Abd 1 View; Future -     DG Lumbar Spine Complete; Future -     POCT urinalysis dipstick -     Urine Culture; Future -     traMADol HCl; Take 1 tablet (50 mg total) by mouth every 12 (twelve) hours as needed for up to 5 days.  Dispense: 10 tablet; Refill: 0  Abdominal pain, unspecified abdominal location Kidney stone,constipation among some to consider. Tramadol can aggravate constipation. Urine dipstick + blood, rest negative. Will send urine for Cx. Increase fluid intake. KUB ordered today and urina strainer given.  -     Basic metabolic panel; Future -     CBC; Future -     DG Abd 1 View; Future -     POCT urinalysis dipstick -     Urine Culture; Future  Nausea without vomiting Zofran 4 mg for symptomatic treatment. Monitor for new symptoms. Clearly instructed about warning signs.  -     Ondansetron HCl; Take 1 tablet (4 mg total) by mouth every 8 (eight) hours as needed for up to 3 days for nausea or vomiting.  Dispense: 9 tablet; Refill: 0  Return in about 1 week (around 06/02/2022) for Back pain with PCP.  Marylene Masek G. Martinique, MD  [facility]. [place] office.

## 2022-05-27 ENCOUNTER — Telehealth: Payer: Self-pay | Admitting: Family Medicine

## 2022-05-27 NOTE — Telephone Encounter (Addendum)
Pt was seen yesterday by Dr. Martinique and is calling for a call back to go over her xray results as soon as possible, as Pt is in a lot of pain and is wondering if she needs to go to the ED.  Pt is aware Dr. Martinique is out today.

## 2022-05-27 NOTE — Telephone Encounter (Signed)
Patient came through from triage line regarding her xray results. I let patient know that the results have not came back yet, so there was nothing that we could tell her at this moment. Patient was very irate. She stated that she wanted her results now and she was in pain and wanted to know what to do. I am unable to tell patient to go the ED due to not being clinical and triage nurse left the line before I could tell her to advise patient to do so. Provider that patient seen in our office is out today.         Please advise

## 2022-05-27 NOTE — Telephone Encounter (Signed)
Not read yet. Ty.

## 2022-05-27 NOTE — Telephone Encounter (Signed)
Patient called to advise that she went to another provider and has not gotten her xray results yet. Advised that it does not look like the radiologist has reviewed it and unfortunately, it would have to be read before the doctor could give results to her. Patient is very upset that she has no clue what it is and she was in extreme pain last night again. Patient said she may go to ED so that she can find out what is going on but wanted [place] to be aware.

## 2022-05-28 ENCOUNTER — Telehealth: Payer: Self-pay

## 2022-05-28 LAB — URINE CULTURE
MICRO NUMBER:: 14246597
Result:: NO GROWTH
SPECIMEN QUALITY:: ADEQUATE

## 2022-05-28 NOTE — Telephone Encounter (Signed)
Caller states she has x-rays done last night and she said she hasn't gotten the results but she is in pain and she cant go another night with the pain she said its in her back and lower abdomen.---caller states she wants her x-ray results from yesterday. she is refusing triage. wanting to speak to office.  05/27/2022 10:24:03 AM Clinical Call Hassell Done, RN, Joelene Millin  Of note: Pt was seen at [facility] ED on 05/27/22. See d/c instructions below: Thank you for allowing me to take care of your healthcare needs. It was a pleasure!  Please continue taking Miralax for constipation. Please start taking keflex for UTI. Please follow up with your primary care physician in 1 week. Please drink plenty of fluids.  If you develop new or concerning symptoms please return to the ED for a re-evaluation

## 2022-05-28 NOTE — Telephone Encounter (Signed)
Called the patient informed of PCP instructions She has received her results now from Parmele MD.  She will follow-up with Dr. Nani Ravens

## 2022-05-31 NOTE — Telephone Encounter (Signed)
Results show some arthritic changes but nothing acute. Abdominal X-ray is unremarkable as well. If Dr. Martinique has nothing further to add at this point and she is still having problems, I would like to see her in our office. Ty.

## 2022-05-31 NOTE — Telephone Encounter (Signed)
Called the patient informed of PCP response/results. She is doing better now and has no need to be seen at this time.

## 2022-05-31 NOTE — Telephone Encounter (Signed)
Called left message to call back 

## 2022-07-23 ENCOUNTER — Telehealth: Payer: Self-pay | Admitting: Family Medicine

## 2022-07-23 MED ORDER — BUPROPION HCL ER (XL) 300 MG PO TB24: 300.0000 mg | ORAL_TABLET | Freq: Every day | ORAL | 2 refills | Status: DC

## 2022-07-23 NOTE — Telephone Encounter (Signed)
Prescription Request  07/23/2022  Is this a "Controlled Substance" medicine? No  LOV: Visit date not found  What is the name of the medication or equipment?  buPROPion (WELLBUTRIN XL) 300 MG 24 hr tablet   Have you contacted your pharmacy to request a refill? No   Which pharmacy would you like this sent to?  [id number] [place] - [id number] [place] [address]Phone: [phone]: [phone]   Patient notified that their request is being sent to the clinical staff for review and that they should receive a response within 2 business days.   Please advise at [phone]

## 2022-07-23 NOTE — Telephone Encounter (Signed)
Patient informed refill done

## 2022-11-11 ENCOUNTER — Ambulatory Visit
Admission: EM | Admit: 2022-11-11 | Discharge: 2022-11-11 | Disposition: A | Discharge status: Home / Self Care | Payer: No Typology Code available for payment source | Attending: Internal Medicine | Admitting: Internal Medicine

## 2022-11-11 DIAGNOSIS — J069 Acute upper respiratory infection, unspecified: Secondary | ICD-10-CM

## 2022-11-11 DIAGNOSIS — Z1152 Encounter for screening for COVID-19: Secondary | ICD-10-CM

## 2022-11-11 LAB — POC SARS CORONAVIRUS 2 AG -  ED: SARS Coronavirus 2 Ag: NEGATIVE

## 2022-11-11 MED ORDER — BENZONATATE 100 MG PO CAPS: 100.0000 mg | ORAL_CAPSULE | Freq: Three times a day (TID) | ORAL | 0 refills | Status: DC

## 2022-11-11 NOTE — Discharge Instructions (Addendum)
You have a viral upper respiratory infection.  COVID is negative.   Use the following medicines to help with symptoms: - Plain Mucinex (guaifenesin) over the counter as directed every 12 hours to thin mucous so that you are able to get it out of your body easier. Drink plenty of water while taking this medication so that it works well in your body (at least 8 cups a day).  - Tylenol 1,000mg  every 6 hours with food as needed for aches/pains or fever/chills.  - Tessalon perles every 8 hours as needed for cough.  1 tablespoon of honey in warm water and/or salt water gargles may also help with symptoms. Humidifier to your room will help add water to the air and reduce coughing.  If you develop any new or worsening symptoms, please return.  If your symptoms are severe, please go to the emergency room.  Follow-up with your primary care provider for further evaluation and management of your symptoms as well as ongoing wellness visits.  I hope you feel better!

## 2022-11-11 NOTE — ED Triage Notes (Signed)
Pt presents to uc with co of chest congestion for 3 days and concern for developing laryngitis as this has happened multiple times in the past has been using alka seltzer.

## 2022-11-11 NOTE — ED Provider Notes (Signed)
[facility]    CSN: [id number] Arrival date & time: 11/11/22  0857      History   Chief Complaint Chief Complaint  Patient presents with   chest congestion     HPI Ariana Mcfarland is a 77 y.o. female.   Patient presents to urgent care for evaluation of dry cough and runny nose for the last 3 days. Had sore throat when syptoms first began but this has resolved. No N/V/D, abodominal pain, rash, dizziness, fever/chills, or body aches. States she is fatigued and feels generally unwell. Former smoker, quit 20 years ago. No recent antiboitic/steroid use. No chest pain, shortness of breath, or heart palpitations. Using alkaseltzer plus and ibuprofen with some relief. States the OTC medicines aren't doing a good job of breaking up mucous to the chest. She has not taken an at home COVID test. Denies recent COVID-19 infection. States she has used an albuterol inhaler in the past but has not needed to use this during this illness.      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 Authorizing Provider  benzonatate (TESSALON) 100 MG capsule Take 1 capsule (100 mg total) by mouth every 8 (eight) hours. 11/11/22  Yes Carlisle Beers, FNP  Ascorbic Acid (VITAMIN C PO) Take 1,000 mg by mouth daily.     [provider]  Ascorbic Acid (VITAMIN C) 1000 MG tablet Take 1,000 mg by mouth daily.    [provider]  buPROPion (WELLBUTRIN XL) 300 MG 24 hr tablet Take 1 tablet (300 mg total) by mouth daily. 07/23/22   Sharlene Dory, DO  CALCIUM PO Take by mouth daily.    [provider]  Cholecalciferol (VITAMIN D PO) Take by mouth.    [provider]  pantoprazole (PROTONIX) 40 MG tablet TAKE 1 TABLET BY MOUTH EVERY DAY 02/09/22   Sharlene Dory, DO  RESVERATROL PO Take by mouth daily.    [provider]    Family History Family History  Problem Relation Age of Onset   Diabetes Mother    Heart disease Mother    Diabetes Father  Heart disease Father     Social History Social History   Tobacco Use   Smoking status: Former    Types: Cigarettes    Quit date: 10/13/2002    Years since quitting: 20.0   Smokeless tobacco: Never  Substance Use Topics   Alcohol use: Yes    Alcohol/week: 2.0 standard drinks of alcohol    Types: 2 Standard drinks or equivalent per week   Drug use: No     Allergies   Elemental sulfur and Sulfa antibiotics   Review of Systems Review of Systems Per HPI  Physical Exam Triage Vital Signs ED Triage Vitals  Enc Vitals Group     BP 11/11/22 0921 122/78     Pulse Rate 11/11/22 0921 77     Resp 11/11/22 0921 16     Temp 11/11/22 0921 98.3 F (36.8 C)     Temp Source 11/11/22 0921 Oral     SpO2 11/11/22 0921 98 %     Weight --      Height --      Head Circumference --      Peak Flow --      Pain Score 11/11/22 0920 0     Pain Loc --      Pain Edu? --      Excl.  in GC? --    No data found.  Updated Vital Signs BP 122/78   Pulse 77   Temp 98.3 F (36.8 C) (Oral)   Resp 16   SpO2 98%   Visual Acuity Right Eye Distance:   Left Eye Distance:   Bilateral Distance:    Right Eye Near:   Left Eye Near:    Bilateral Near:     Physical Exam Vitals and nursing note reviewed.  Constitutional:      Appearance: She is not ill-appearing or toxic-appearing.  HENT:     Head: Normocephalic and atraumatic.     Right Ear: Hearing, tympanic membrane, ear canal and external ear normal.     Left Ear: Hearing, tympanic membrane, ear canal and external ear normal.     Nose: Rhinorrhea present.     Mouth/Throat:     Lips: Pink.     Mouth: Mucous membranes are moist. No injury.     Tongue: No lesions. Tongue does not deviate from midline.     Palate: No mass and lesions.     Pharynx: Oropharynx is clear. Uvula midline. No pharyngeal swelling, oropharyngeal exudate, posterior oropharyngeal erythema or uvula swelling.     Tonsils: No tonsillar exudate or tonsillar abscesses.     Comments: Small amount of clear postnasal drainage visualized to the posterior oropharynx.  Eyes:     General: Lids are normal. Vision grossly intact. Gaze aligned appropriately.     Extraocular Movements: Extraocular movements intact.     Conjunctiva/sclera: Conjunctivae normal.  Cardiovascular:     Rate and Rhythm: Normal rate and regular rhythm.     Heart sounds: Normal heart sounds, S1 normal and S2 normal.  Pulmonary:     Effort: Pulmonary effort is normal. No respiratory distress.     Breath sounds: Normal breath sounds and air entry. No stridor. No wheezing, rhonchi or rales.  Chest:     Chest wall: No tenderness.  Musculoskeletal:     Cervical back: Neck supple.     Right lower leg: No edema.     Left lower leg: No edema.  Lymphadenopathy:     Cervical: No cervical adenopathy.  Skin:    General: Skin is warm and dry.     Capillary Refill: Capillary refill takes  less than 2 seconds.     Findings: No rash.  Neurological:     General: No focal deficit present.     Mental Status: She is alert and oriented to person, place, and time. Mental status is at baseline.     Cranial Nerves: No dysarthria or facial asymmetry.  Psychiatric:        Mood and Affect: Mood normal.        Speech: Speech normal.        Behavior: Behavior normal.        Thought Content: Thought content normal.        Judgment: Judgment normal.      UC Treatments / Results  Labs (all labs ordered are listed, but only abnormal results are displayed) Labs Reviewed  POC SARS CORONAVIRUS 2 AG -  ED    EKG   Radiology No results found.  Procedures Procedures (including critical care time)  Medications Ordered in UC Medications - No data to display  Initial Impression / Assessment and Plan / UC Course  I have reviewed the triage vital signs and the nursing notes.  Pertinent labs & imaging results that were available during my care of the patient were reviewed by me and considered in my medical decision making (see chart for details).   1. Viral URI with cough Symptoms and physical exam consistent with a viral upper respiratory tract infection that will likely resolve with rest, fluids, and prescriptions for symptomatic relief. Deferred imaging based on stable cardiopulmonary exam and hemodynamically stable vital signs. Rapid COVID test is negative.   Tessalon perles sent to pharmacy for symptomatic relief to be taken as prescribed.  May continue taking over the counter medications as directed for further symptomatic relief.  Drowsiness precautions discussed regarding promethazine DM prescription.  Nonpharmacologic interventions for symptom relief provided and after visit summary below. Advised to push fluids to stay well hydrated while recovering from viral illness.   Discussed physical exam and available lab work findings in clinic with patient.  Counseled patient  regarding appropriate use of medications and potential side effects for all medications recommended or prescribed today. Discussed red flag signs and symptoms of worsening condition,when to call the PCP office, return to urgent care, and when to seek higher level of care in the emergency department. Patient verbalizes understanding and agreement with plan. All questions answered. Patient discharged in stable condition.     Final Clinical Impressions(s) / UC Diagnoses   Final diagnoses:  Viral URI with cough  Encounter for screening for COVID-19     Discharge Instructions      You have a viral upper respiratory infection.  COVID is negative.   Use the following medicines to help with symptoms: - Plain Mucinex (guaifenesin) over the counter as directed every 12 hours to thin mucous so that you are able to get it out of your body easier. Drink plenty of water while taking this medication so that it works well in your body (at least 8 cups a day).  - Tylenol 1,000mg  every 6 hours with food as needed for aches/pains or fever/chills.  - Tessalon perles every 8 hours as needed for cough.  1 tablespoon of honey in warm water and/or salt water gargles may also help with symptoms. Humidifier to your room will help add water to the air and reduce coughing.  If you develop  any new or worsening symptoms, please return.  If your symptoms are severe, please go to the emergency room.  Follow-up with your primary care provider for further evaluation and management of your symptoms as well as ongoing wellness visits.  I hope you feel better!     ED Prescriptions     Medication Sig Dispense Auth. Provider   benzonatate (TESSALON) 100 MG capsule Take 1 capsule (100 mg total) by mouth every 8 (eight) hours. 21 capsule Carlisle Beers, FNP      PDMP not reviewed this encounter.   Carlisle Beers, [place] 11/11/22 1026

## 2022-12-17 ENCOUNTER — Telehealth: Payer: Self-pay | Admitting: Family Medicine

## 2022-12-17 NOTE — Telephone Encounter (Signed)
Pt states she had cataract surgery on her right eye in April and is still needing it in her left. She does not feel comfortable driving that far so she would rather see a dr in [place].   Cain Saupe, MD  [facility]  [address]  [place], [place] [address]  [phone]

## 2022-12-20 ENCOUNTER — Other Ambulatory Visit: Payer: Self-pay | Admitting: Family Medicine

## 2022-12-20 DIAGNOSIS — H269 Unspecified cataract: Secondary | ICD-10-CM

## 2022-12-20 NOTE — Telephone Encounter (Signed)
Referral done

## 2023-01-10 ENCOUNTER — Other Ambulatory Visit (HOSPITAL_BASED_OUTPATIENT_CLINIC_OR_DEPARTMENT_OTHER): Payer: Self-pay | Admitting: Family Medicine

## 2023-01-10 DIAGNOSIS — Z1231 Encounter for screening mammogram for malignant neoplasm of breast: Secondary | ICD-10-CM

## 2023-01-17 ENCOUNTER — Ambulatory Visit (HOSPITAL_BASED_OUTPATIENT_CLINIC_OR_DEPARTMENT_OTHER)
Admission: RE | Admit: 2023-01-17 | Discharge: 2023-01-17 | Disposition: A | Discharge status: Home / Self Care | Payer: No Typology Code available for payment source | Source: Ambulatory Visit | Attending: Family Medicine | Admitting: Family Medicine

## 2023-01-17 ENCOUNTER — Encounter (HOSPITAL_BASED_OUTPATIENT_CLINIC_OR_DEPARTMENT_OTHER): Payer: Self-pay

## 2023-01-17 DIAGNOSIS — Z1231 Encounter for screening mammogram for malignant neoplasm of breast: Secondary | ICD-10-CM | POA: Insufficient documentation

## 2023-01-28 ENCOUNTER — Ambulatory Visit (INDEPENDENT_AMBULATORY_CARE_PROVIDER_SITE_OTHER): Payer: No Typology Code available for payment source | Admitting: Family Medicine

## 2023-01-28 ENCOUNTER — Other Ambulatory Visit: Payer: Self-pay | Admitting: Family Medicine

## 2023-01-28 ENCOUNTER — Encounter: Payer: Self-pay | Admitting: Family Medicine

## 2023-01-28 VITALS — BP 120/80 | HR 80 | Temp 97.6°F | Ht 63.0 in | Wt 132.0 lb

## 2023-01-28 DIAGNOSIS — Z23 Encounter for immunization: Secondary | ICD-10-CM | POA: Diagnosis not present

## 2023-01-28 DIAGNOSIS — Z0000 Encounter for general adult medical examination without abnormal findings: Secondary | ICD-10-CM

## 2023-01-28 DIAGNOSIS — E782 Mixed hyperlipidemia: Secondary | ICD-10-CM

## 2023-01-28 LAB — COMPREHENSIVE METABOLIC PANEL
ALT: 17 U/L (ref 0–35)
AST: 17 U/L (ref 0–37)
Albumin: 4.2 g/dL (ref 3.5–5.2)
Alkaline Phosphatase: 73 U/L (ref 39–117)
BUN: 14 mg/dL (ref 6–23)
CO2: 27 mEq/L (ref 19–32)
Calcium: 9.1 mg/dL (ref 8.4–10.5)
Chloride: 104 mEq/L (ref 96–112)
Creatinine, Ser: 0.73 mg/dL (ref 0.40–1.20)
GFR: 80.3 mL/min (ref >60.00)
Glucose, Bld: 87 mg/dL (ref 70–99)
Potassium: 4.3 mEq/L (ref 3.5–5.1)
Sodium: 139 mEq/L (ref 135–145)
Total Bilirubin: 0.4 mg/dL (ref 0.2–1.2)
Total Protein: 6.5 g/dL (ref 6.0–8.3)

## 2023-01-28 LAB — LDL CHOLESTEROL, DIRECT: Direct LDL: 146 mg/dL

## 2023-01-28 LAB — CBC
HCT: 41.9 % (ref 36.0–46.0)
Hemoglobin: 13.1 g/dL (ref 12.0–15.0)
MCHC: 31.4 g/dL (ref 30.0–36.0)
MCV: 87.8 fl (ref 78.0–100.0)
Platelets: 300 10*3/uL (ref 150.0–400.0)
RBC: 4.77 Mil/uL (ref 3.87–5.11)
RDW: 15.5 % (ref 11.5–15.5)
WBC: 6.3 10*3/uL (ref 4.0–10.5)

## 2023-01-28 LAB — LIPID PANEL
Cholesterol: 239 mg/dL — ABNORMAL HIGH (ref 0–200)
HDL: 54.9 mg/dL (ref >39.00)
NonHDL: 184.41
Total CHOL/HDL Ratio: 4
Triglycerides: 235 mg/dL — ABNORMAL HIGH (ref 0.0–149.0)
VLDL: 47 mg/dL — ABNORMAL HIGH (ref 0.0–40.0)

## 2023-01-28 MED ORDER — BUPROPION HCL ER (XL) 300 MG PO TB24: 300.0000 mg | ORAL_TABLET | Freq: Every day | ORAL | 3 refills | Status: DC

## 2023-01-28 NOTE — Progress Notes (Signed)
Chief Complaint  Patient presents with   Annual Exam     Well Woman Ariana Mcfarland is here for a complete physical.   Her last physical was >1 year ago.  Current diet: in general, a "healthy" diet. Current exercise: some walking. Weight is stable and she denies daytime fatigue. Seatbelt? Yes Advanced directive? Yes  Health Maintenance Colonoscopy- Yes Shingrix- No DEXA- Yes Mammogram- Yes Tetanus- No- getting today.  Pneumonia- Yes Hep C screen- Yes  Past Medical History:  Diagnosis Date   Anxiety associated with depression    History of melanoma excision    Vaginal Pap smear, abnormal      Past Surgical History:  Procedure Laterality Date   BASAL CELL CARCINOMA EXCISION     x 3    MELANOMA EXCISION  2004    Medications  Current Outpatient Medications on File Prior to Visit  Medication Sig Dispense Refill   Ascorbic Acid (VITAMIN C) 1000 MG tablet Take 1,000 mg by mouth daily.     buPROPion (WELLBUTRIN XL) 300 MG 24 hr tablet Take 1 tablet (300 mg total) by mouth daily. 90 tablet 2   CALCIUM PO Take by mouth daily.     Cholecalciferol (VITAMIN D PO) Take by mouth.     NON FORMULARY Take 2 capsules by mouth daily. Serevital     RESVERATROL PO Take by mouth daily.      Allergies Allergies  Allergen Reactions   Elemental Sulfur    Sulfa Antibiotics     Headache    Review of Systems: Constitutional:  no fevers Eye:  no recent significant change in vision Ears:  No changes in hearing Nose/Mouth/Throat:  no complaints of nasal congestion, no sore throat Cardiovascular: no chest pain Respiratory:  No shortness of breath Gastrointestinal:  No change in bowel habits GU:  Female: negative for dysuria Integumentary:  no abnormal skin lesions reported Neurologic:  no headaches Endocrine:  denies unexplained weight changes  Exam BP 120/80 (BP Location: Left Arm, Patient Position: Sitting, Cuff Size: Normal)   Pulse 80   Temp 97.6 F (36.4 C) (Oral)   Ht 5\' 3"   (1.6 m)   Wt 132 lb (59.9 kg)   SpO2 96%   BMI 23.38 kg/m  General:  well developed, well nourished, in no apparent distress Skin:  no significant moles, warts, or growths Head:  no masses, lesions, or tenderness Eyes:  pupils equal and round, sclera anicteric without injection Ears:  canals without lesions, TMs shiny without retraction, no obvious effusion, no erythema Nose:  nares patent, mucosa normal, and no drainage Throat/Pharynx:  lips and gingiva without lesion; tongue and uvula midline; non-inflamed pharynx; no exudates or postnasal drainage Neck: neck supple without adenopathy, thyromegaly, or masses Lungs:  clear to auscultation, breath sounds equal bilaterally, no respiratory distress Cardio:  regular rate and rhythm, no bruits or LE edema Abdomen:  abdomen soft, nontender; bowel sounds normal; no masses or organomegaly Genital: Deferred Neuro:  gait normal; deep tendon reflexes normal and symmetric Psych: well oriented with normal range of affect and appropriate judgment/insight  Assessment and Plan  Well adult exam - Plan: CBC, Comprehensive metabolic panel, Lipid panel   Well 77 y.o. female. Counseled on diet and exercise. Shingrix rec'd.  Tdap today.  Other orders as above. Follow up in 6 mo. The patient voiced understanding and agreement to the plan.  Jilda Roche [place], DO 01/28/23 10:15 AM

## 2023-01-28 NOTE — Patient Instructions (Signed)
Give [place] 2-3 business days to get the results of your labs back.   Keep the diet clean and stay active.  Please get me a copy of your advanced directive form at your convenience.   The Shingrix vaccine (for shingles) is a 2 shot series spaced 2-6 months apart. It can make people feel low energy, achy and almost like they have the flu for 48 hours after injection. 1/5 people can have nausea and/or vomiting. Please plan accordingly when deciding on when to get this shot. Call our office for a nurse visit appointment to get this. The second shot of the series is less severe regarding the side effects, but it still lasts 48 hours.   I recommend getting the flu shot in mid October. This suggestion would change if the CDC comes out with a different recommendation.   Let us know if you need anything.

## 2023-02-02 ENCOUNTER — Telehealth: Payer: Self-pay | Admitting: Family Medicine

## 2023-02-02 NOTE — Telephone Encounter (Signed)
Called the patient back and informed of PCP instructions. She is concerned about having a blood clot She said it is tingling

## 2023-02-02 NOTE — Telephone Encounter (Signed)
She can schedule an appt if she's nervous, but this is a site reaction, not a blood clot.

## 2023-02-02 NOTE — Telephone Encounter (Signed)
Probably a site reaction. Not super common but something we do see from time to time. Tylenol and ice, hydrocortisone cream.

## 2023-02-02 NOTE — Telephone Encounter (Signed)
Pt called to advise that she received her tetanus shot last week and her arm has been really achey and hurting. She said that a knot came up and it was really red. The swelling has gone down some but it's still red and itching. Pt wants to know if this is normal. Please call her to advise if it's normal and if she can do anything at home to help this.

## 2023-02-02 NOTE — Telephone Encounter (Signed)
Called informed the patient of PCP response. She verbalized understanding.

## 2023-03-11 ENCOUNTER — Other Ambulatory Visit (INDEPENDENT_AMBULATORY_CARE_PROVIDER_SITE_OTHER): Payer: No Typology Code available for payment source

## 2023-03-11 DIAGNOSIS — E782 Mixed hyperlipidemia: Secondary | ICD-10-CM | POA: Diagnosis not present

## 2023-03-11 LAB — LIPID PANEL
Cholesterol: 254 mg/dL — ABNORMAL HIGH (ref 0–200)
HDL: 57.6 mg/dL (ref >39.00)
LDL Cholesterol: 160 mg/dL — ABNORMAL HIGH (ref 0–99)
NonHDL: 196.5
Total CHOL/HDL Ratio: 4
Triglycerides: 181 mg/dL — ABNORMAL HIGH (ref 0.0–149.0)
VLDL: 36.2 mg/dL (ref 0.0–40.0)

## 2023-07-29 ENCOUNTER — Ambulatory Visit: Payer: No Typology Code available for payment source | Admitting: Family Medicine

## 2023-07-29 ENCOUNTER — Encounter: Payer: Self-pay | Admitting: Family Medicine

## 2023-07-29 VITALS — BP 126/76 | HR 85 | Temp 97.8°F | Resp 16 | Ht 63.0 in | Wt 133.0 lb

## 2023-07-29 DIAGNOSIS — S76302A Unspecified injury of muscle, fascia and tendon of the posterior muscle group at thigh level, left thigh, initial encounter: Secondary | ICD-10-CM

## 2023-07-29 DIAGNOSIS — M7062 Trochanteric bursitis, left hip: Secondary | ICD-10-CM | POA: Diagnosis not present

## 2023-07-29 NOTE — Progress Notes (Signed)
Musculoskeletal Exam  Patient: Ariana Mcfarland DOB: 1946/06/06  DOS: 07/29/2023  SUBJECTIVE:  Chief Complaint:   Chief Complaint  Patient presents with   Leg Pain    Left Leg pain    Ariana Mcfarland is a 78 y.o.  female for evaluation and treatment of buttock/hamstring pain.   Onset:  1 month ago. No inj or change in activity.  Location: buttock, hamstring, prox calf on L Character:   squeezing   Progression of issue:  is unchanged Associated symptoms: pain w going up stairs or standing up; goes away when she starts walking Treatment: to date has been ice, OTC NSAIDS, acetaminophen.   Neurovascular symptoms: no  Past Medical History:  Diagnosis Date   Anxiety associated with depression    History of melanoma excision    Vaginal Pap smear, abnormal     Objective: VITAL SIGNS: BP 126/76   Pulse 85   Temp 97.8 F (36.6 C) (Oral)   Resp 16   Ht 5\' 3"  (1.6 m)   Wt 133 lb (60.3 kg)   SpO2 98%   BMI 23.56 kg/m  Constitutional: Well formed, well developed. No acute distress. Thorax & Lungs: No accessory muscle use Musculoskeletal: L thigh.   Normal active range of motion: yes.   Normal passive range of motion: yes Tenderness to palpation: yes over mid hamstring and greater troch Deformity: no Ecchymosis: no Tests positive: none Tests negative: Logroll, straight leg Neurologic: Normal sensory function. No focal deficits noted. DTR's equal and symmetric in LE's. No clonus.  Gait is normal. Psychiatric: Normal mood. Age appropriate judgment and insight. Alert & oriented x 3.    Assessment:  Greater trochanteric bursitis of left hip  Left hamstring injury, initial encounter  Plan: Stretches/exercises for the IT band and hamstring provided, heat, ice, Tylenol.  Could consider bursa injection versus physical therapy referral. F/u as originally scheduled. The patient voiced understanding and agreement to the plan.   Jilda Roche [place], DO 07/29/23  12:12 PM

## 2023-07-29 NOTE — Patient Instructions (Addendum)
Heat (pad or rice pillow in microwave) over affected area, 10-15 minutes twice daily.   Ice/cold pack over area for 10-15 min twice daily.  OK to take Tylenol 1000 mg (2 extra strength tabs) or 975 mg (3 regular strength tabs) every 6 hours as needed.  Let us know if you need anything.  Semimembranosus Tendinitis Rehab  It is normal to feel mild stretching, pulling, tightness, or discomfort as you do these exercises, but you should stop right away if you feel sudden pain or your pain gets worse.  Stretching and range of motion exercises These exercises warm up your muscles and joints and improve the movement and flexibility of your thigh. These exercises also help to relieve pain, numbness, and tingling. Exercise A: Hamstring stretch, supine    Lie on your back. Loop a belt or towel across the ball of your left / right foot The ball of your foot is on the walking surface, right under your toes. Straighten your left / right knee and slowly pull on the belt to raise your leg. Stop when you feel a gentle stretch behind your left / right knee or thigh. Do not allow the knee to bend. Keep your other leg flat on the floor. Hold this position for 30 seconds. Repeat 2 times. Complete this exercise 3 times a week. Strengthening exercises These exercises build strength and endurance in your thigh. Endurance is the ability to use your muscles for a long time, even after they get tired. Exercise B: Straight leg raises (hip extensors) Lie on your belly on a bed or a firm surface with a pillow under your hips. Bend your left / right knee so your foot is straight up in the air. Squeeze your buttock muscles and lift your left / right thigh off the bed. Do not let your back arch. Hold this position for 3 seconds. Slowly return to the starting position. Let your muscles relax completely before you do another repetition. Repeat 2 times. Complete this exercise 3 times a week. Exercise C: Bridge (hip  extensors)     Lie on your back on a firm surface with your knees bent and your feet flat on the floor. Tighten your buttocks muscles and lift your bottom off the floor until your trunk is level with your thighs. You should feel the muscles working in your buttocks and the back of your thighs. If you do not feel these muscles, slide your feet 1-2 inches (2.5-5 cm) farther away from your buttocks. Do not arch your back. Hold this position for 3 seconds. Slowly lower your hips to the starting position. Let your buttocks muscles relax completely between repetitions. If this exercise is too easy, try doing it with your arms crossed over your chest. Repeat 2 times. Complete this exercise 3 times a week. Exercise D: Hamstring eccentric, prone Lie on your belly on a bed or on the floor. Start with your legs straight. Cross your legs at the ankles with your left / right leg on top. Using your bottom leg to do the work, bend both knees. Using just your left / right leg alone, slowly lower your leg back down toward the bed. Add a 5 lb weight as told by your health care provider. Let your muscles relax completely between repetitions. Repeat 2 times. Complete this exercise 3 times a week. Exercise E: Squats Stand in front of a table, with your feet and knees pointing straight ahead. You may rest your hands on the table  for balance but not for support. Slowly bend your knees and lower your hips like you are going to sit in a chair. Keep your thighs straight or pointed slightly outward. Keep your weight over your heels, not over your toes. Keep your lower legs upright so they are parallel with the table legs. Do not let your hips go lower than your knees. Stop when your knees are bent to the shape of an upside-down letter "L" (90 degree angle). Do not bend lower than told by your health care provider. If your knee pain increases, do not bend as low. Hold the squat position 1-2 seconds. Slowly push with  your legs to return to standing. Do not use your hands to pull yourself to standing. Repeat 2 times. Complete this exercise 3 times a week. Make sure you discuss any questions you have with your health care provider. Document Released: 06/14/2005 Document Revised: 02/19/2016 Document Reviewed: 03/18/2015 Elsevier Interactive Patient Education  2018 [employer]  Iliotibial Band Syndrome Rehab It is normal to feel mild stretching, pulling, tightness, or discomfort as you do these exercises, but you should stop right away if you feel sudden pain or your pain gets worse.  Stretching and range of motion exercises These exercises warm up your muscles and joints and improve the movement and flexibility of your hip and pelvis. Exercise A: Quadriceps, prone    Lie on your abdomen on a firm surface, such as a bed or padded floor. Bend your left / right knee and hold your ankle. If you cannot reach your ankle or pant leg, loop a belt around your foot and grab the belt instead. Gently pull your heel toward your buttocks. Your knee should not slide out to the side. You should feel a stretch in the front of your thigh and knee. Hold this position for 30 seconds. Repeat 2 times. Complete this stretch 3 times per week. Exercise B: Iliotibial band    Lie on your side with your left / right leg in the top position. Bend both of your knees and grab your left / right ankle. Stretch out your bottom arm to help you balance. Slowly bring your top knee back so your thigh goes behind your trunk. Slowly lower your top leg toward the floor until you feel a gentle stretch on the outside of your left / right hip and thigh. If you do not feel a stretch and your knee will not fall farther, place the heel of your other foot on top of your knee and pull your knee down toward the floor with your foot. Hold this position for 30 seconds. Repeat 2 times. Complete this stretch 3 times per week. Strengthening  exercises These exercises build strength and endurance in your hip and pelvis. Endurance is the ability to use your muscles for a long time, even after they get tired. Exercise C: Straight leg raises (hip abductors)     Lie on your side with your left / right leg in the top position. Lie so your head, shoulder, knee, and hip line up. You may bend your bottom knee to help you balance. Roll your hips slightly forward so your hips are stacked directly over each other and your left / right knee is facing forward. Tense the muscles in your outer thigh and lift your top leg 4-6 inches (10-15 cm). Hold this position for 3 seconds. Repeat for a total of 10 reps. Slowly return to the starting position. Let your muscles relax completely  before doing another repetition. Repeat 2 times. Complete this exercise 3 times per week. Exercise D: Straight leg raises (hip extensors) Lie on your abdomen on your bed or a firm surface. You can put a pillow under your hips if that is more comfortable. Bend your left / right knee so your foot is straight up in the air. Squeeze your buttock muscles and lift your left / right thigh off the bed. Do not let your back arch. Tense this muscle as hard as you can without increasing any knee pain. Hold this position for 2 seconds. Repeat for a total of 10 reps Slowly lower your leg to the starting position and allow it to relax completely. Repeat 2 times. Complete this exercise 3 times per week. Exercise E: Hip hike Stand sideways on a bottom step. Stand on your left / right leg with your other foot unsupported next to the step. You can hold onto the railing or wall if needed for balance. Keep your knees straight and your torso square. Then, lift your left / right hip up toward the ceiling. Slowly let your left / right hip lower toward the floor, past the starting position. Your foot should get closer to the floor. Do not lean or bend your knees. Repeat 2 times. Complete this  exercise 3 times per week.  Document Released: 06/14/2005 Document Revised: 02/17/2016 Document Reviewed: 05/16/2015 Elsevier Interactive Patient Education  [employer].

## 2023-09-07 ENCOUNTER — Encounter: Payer: Self-pay | Admitting: Family Medicine

## 2023-09-07 ENCOUNTER — Ambulatory Visit: Payer: Self-pay | Admitting: Family Medicine

## 2023-09-07 ENCOUNTER — Ambulatory Visit (INDEPENDENT_AMBULATORY_CARE_PROVIDER_SITE_OTHER): Admitting: Family Medicine

## 2023-09-07 VITALS — BP 112/68 | HR 84 | Temp 98.1°F | Ht 63.0 in | Wt 131.0 lb

## 2023-09-07 DIAGNOSIS — J988 Other specified respiratory disorders: Secondary | ICD-10-CM

## 2023-09-07 MED ORDER — AMOXICILLIN-POT CLAVULANATE 875-125 MG PO TABS: 1.0000 | ORAL_TABLET | Freq: Two times a day (BID) | ORAL | 0 refills | Status: AC

## 2023-09-07 MED ORDER — BENZONATATE 100 MG PO CAPS: 100.0000 mg | ORAL_CAPSULE | Freq: Two times a day (BID) | ORAL | 0 refills | Status: DC | PRN

## 2023-09-07 NOTE — Telephone Encounter (Signed)
 Copied from CRM [id number]. Topic: Clinical - Red Word Triage >> Sep 07, 2023  8:14 AM Isabell A wrote: [facility] that prompted transfer to Nurse Triage: Experiencing chest tightness during the night and wheezing.   Chief Complaint: cough Symptoms: wheezing at night, runny nose Frequency: ongoing 9-10 days Pertinent Negatives: Patient denies fever Disposition: [] ED /[] Urgent Care (no appt availability in office) / [x] Appointment(In office/virtual)/ []  [facility]/ [] Home Care/ [] Refused Recommended Disposition /[] [facility]/ []  Follow-up with PCP Additional Notes:  The patient reported chest tightness and wheezing at night that has been ongoing for 9-10 days.  She stated that the tightness is not pain.  It is located below both breasts.  Her husband was diagnosed with Flu A but she was sick first so she is assuming that is what she has.  She has a productive cough with clear sputum that keeps her up at night.  She feels somewhat short of breath after coughing.  She has not exerted herself in a while and felt a little winded when she took the trash.  Her nose is runny with clear discharge.  She reported wheezing at night that sounds like a creaking door.  She had an albuterol inhaler from when she had covid and has been using that which relieves the wheezing.  She stated that she feels that she is getting her strength back but is concerned that the symptoms are lasting so long and is worried she has pneumonia.  She was scheduled for a same day appointment for further evaluation.   Reason for Disposition  SEVERE coughing spells (e.g., whooping sound after coughing, vomiting after coughing)  Answer Assessment - Initial Assessment Questions 1. LOCATION: "Where does it hurt?"       Under breasts;  2. RADIATION: "Does the pain go anywhere else?" (e.g., into neck, jaw, arms, back)     No  3. ONSET: "When did the chest pain begin?" (Minutes, hours or days)      9-10th day 4.  PATTERN: "Does the pain come and go, or has it been constant since it started?"  "Does it get worse with exertion?"      Constant  6. SEVERITY: "How bad is the pain?"  (e.g., Scale 1-10; mild, moderate, or severe)    - MILD (1-3): doesn't interfere with normal activities     - MODERATE (4-7): interferes with normal activities or awakens from sleep    - SEVERE (8-10): excruciating pain, unable to do any normal activities       0/10 not painful  8. PULMONARY RISK FACTORS: "Do you have any history of lung disease?"  (e.g., blood clots in lung, asthma, emphysema, birth control pills)     Asthma as a child  9. CAUSE: "What do you think is causing the chest pain?"     Possibly due to flu 10. OTHER SYMPTOMS: "Do you have any other symptoms?" (e.g., dizziness, nausea, vomiting, sweating, fever, difficulty breathing, cough)       Coughing, cracking in lungs, coughing  Albuterol helps  Answer Assessment - Initial Assessment Questions 1. ONSET: "When did the cough begin?"      9-10 days  2. SEVERITY: "How bad is the cough today?"      Severe  3. SPUTUM: "Describe the color of your sputum" (none, dry cough; clear, white, yellow, green)     Yellowish and clear  4. HEMOPTYSIS: "Are you coughing up any blood?" If so ask: "How much?" (flecks, streaks, tablespoons,  etc.)     None  5. DIFFICULTY BREATHING: "Are you having difficulty breathing?" If Yes, ask: "How bad is it?" (e.g., mild, moderate, severe)    - MILD: No SOB at rest, mild SOB with walking, speaks normally in sentences, can lie down, no retractions, pulse < 100.    - MODERATE: SOB at rest, SOB with minimal exertion and prefers to sit, cannot lie down flat, speaks in phrases, mild retractions, audible wheezing, pulse 100-120.    - SEVERE: Very SOB at rest, speaks in single words, struggling to breathe, sitting hunched forward, retractions, pulse > 120      Shortness of breath worse with coughing  6. FEVER: "Do you have a fever?" If Yes, ask:  "What is your temperature, how was it measured, and when did it start?"     No fever 7. CARDIAC HISTORY: "Do you have any history of heart disease?" (e.g., heart attack, congestive heart failure)      None  8. LUNG HISTORY: "Do you have any history of lung disease?"  (e.g., pulmonary embolus, asthma, emphysema)     Asthma as a child   10. OTHER SYMPTOMS: "Do you have any other symptoms?" (e.g., runny nose, wheezing, chest pain)       Nasal congestion, wheezing worse at night  Protocols used: Chest Pain-A-AH, Cough - Acute Productive-A-AH

## 2023-09-07 NOTE — Progress Notes (Signed)
 Acute Office Visit  Subjective:     Patient ID: Ariana Mcfarland, female    DOB: Nov 16, 1945, 78 y.o.   MRN: [id number]  Chief Complaint  Patient presents with   Influenza    HPI Patient is in today for URI.   Discussed the use of AI scribe software for clinical note transcription with the patient, who gave verbal consent to proceed.  History of Present Illness The patient presents with worsening respiratory symptoms following a flu-like illness. Her husband recently tested positive for flu type A.  Approximately ten days ago, she began experiencing fatigue, a sore throat, and headaches, initially attributing these symptoms to a cold. Her condition has since deteriorated, with increasing nasal congestion and a sensation of chest tightness, particularly when lying down at night. She describes her breathing as occasionally difficult, with audible breathing sounds at night. No fever has been noted. Current symptoms include persistent nasal congestion and a cough producing mucus that does not fully clear from her throat. She has experienced two coughing fits in the past two days, one at a cancer center and another at home.  She has been using albuterol, previously prescribed for COVID-19, which she finds helpful. She has also taken ibuprofen occasionally. Her oxygen saturation has decreased from her usual 97-98% to 93%.  Her husband, who has a history of bone marrow cancer, tested positive for flu type A two days ago, after she began experiencing symptoms. She was with her granddaughter the day before her symptoms started, but the granddaughter is reportedly fine.  No fever is present. She reports nasal congestion, a sensation of chest tightness, and difficulty breathing at night. She experiences persistent coughing fits and has a history of using albuterol for respiratory symptoms. She recently found her albuterol and it has been helpful with the breathing.      ROS All review of systems  negative except what is listed in the HPI      Objective:    BP 112/68   Pulse 84   Temp 98.1 F (36.7 C) (Oral)   Ht 5\' 3"  (1.6 m)   Wt 131 lb (59.4 kg)   SpO2 93%   BMI 23.21 kg/m    Physical Exam Vitals reviewed.  Constitutional:      Appearance: Normal appearance.  Cardiovascular:     Rate and Rhythm: Normal rate and regular rhythm.  Pulmonary:     Effort: Pulmonary effort is normal. No respiratory distress.     Breath sounds: No rales.     Comments: Faint rhonchi cleared with coughing  Skin:    General: Skin is warm and dry.  Neurological:     Mental Status: She is alert and oriented to person, place, and time.  Psychiatric:        Mood and Affect: Mood normal.        Behavior: Behavior normal.        Thought Content: Thought content normal.        Judgment: Judgment normal.        No results found for any visits on 09/07/23.      Assessment & Plan:   Problem List Items Addressed This Visit   None Visit Diagnoses       Respiratory infection    -  Primary   Relevant Medications   amoxicillin-clavulanate (AUGMENTIN) 875-125 MG tablet   benzonatate (TESSALON) 100 MG capsule       Assessment & Plan  Symptoms suggest secondary bacterial infection. Oxygen saturation  at 93%.  - Prescribe Augmentin for one week. - Advise taking Augmentin with food and water. - Monitor symptoms and message via MyChart by Monday if no improvement. - Consider chest x-ray if no improvement by Monday. - Use albuterol inhaler as needed for dyspnea, wheezing, or coughing. - Suggest Mucinex for congestion. - Prescribe Tessalon Perles for cough. - Encourage rest and increased fluid intake.    Meds ordered this encounter  Medications   amoxicillin-clavulanate (AUGMENTIN) 875-125 MG tablet    Sig: Take 1 tablet by mouth 2 (two) times daily for 7 days.    Dispense:  14 tablet    Refill:  0    Supervising Provider:   Danise Edge A [[id number]]   benzonatate (TESSALON) 100  MG capsule    Sig: Take 1 capsule (100 mg total) by mouth 2 (two) times daily as needed for cough.    Dispense:  20 capsule    Refill:  0    Supervising Provider:   Danise Edge A [[id number]]    Return if symptoms worsen or fail to improve.  Clayborne Dana, NP

## 2024-02-06 ENCOUNTER — Encounter: Payer: Self-pay | Admitting: Family Medicine

## 2024-02-06 ENCOUNTER — Ambulatory Visit: Payer: Self-pay | Admitting: Family Medicine

## 2024-02-06 ENCOUNTER — Ambulatory Visit (INDEPENDENT_AMBULATORY_CARE_PROVIDER_SITE_OTHER): Payer: No Typology Code available for payment source | Admitting: Family Medicine

## 2024-02-06 VITALS — BP 128/78 | HR 83 | Temp 97.9°F | Resp 16 | Ht 63.0 in | Wt 132.0 lb

## 2024-02-06 DIAGNOSIS — Z23 Encounter for immunization: Secondary | ICD-10-CM

## 2024-02-06 DIAGNOSIS — E782 Mixed hyperlipidemia: Secondary | ICD-10-CM | POA: Diagnosis not present

## 2024-02-06 DIAGNOSIS — Z0000 Encounter for general adult medical examination without abnormal findings: Secondary | ICD-10-CM

## 2024-02-06 LAB — COMPREHENSIVE METABOLIC PANEL WITH GFR
ALT: 15 U/L (ref 0–35)
AST: 15 U/L (ref 0–37)
Albumin: 4.1 g/dL (ref 3.5–5.2)
Alkaline Phosphatase: 66 U/L (ref 39–117)
BUN: 14 mg/dL (ref 6–23)
CO2: 28 meq/L (ref 19–32)
Calcium: 8.7 mg/dL (ref 8.4–10.5)
Chloride: 104 meq/L (ref 96–112)
Creatinine, Ser: 0.64 mg/dL (ref 0.40–1.20)
GFR: 85.67 mL/min (ref >60.00)
Glucose, Bld: 79 mg/dL (ref 70–99)
Potassium: 4.3 meq/L (ref 3.5–5.1)
Sodium: 140 meq/L (ref 135–145)
Total Bilirubin: 0.4 mg/dL (ref 0.2–1.2)
Total Protein: 6.1 g/dL (ref 6.0–8.3)

## 2024-02-06 LAB — LIPID PANEL
Cholesterol: 219 mg/dL — ABNORMAL HIGH (ref 0–200)
HDL: 58.5 mg/dL (ref >39.00)
LDL Cholesterol: 128 mg/dL — ABNORMAL HIGH (ref 0–99)
NonHDL: 160.63
Total CHOL/HDL Ratio: 4
Triglycerides: 164 mg/dL — ABNORMAL HIGH (ref 0.0–149.0)
VLDL: 32.8 mg/dL (ref 0.0–40.0)

## 2024-02-06 NOTE — Progress Notes (Signed)
 Chief Complaint  Patient presents with   Annual Exam    CPE     Well Woman Ariana Mcfarland is here for a complete physical.   Her last physical was >1 year ago.  Current diet: in general, a healthy diet. Current exercise: none. Weight is stable and she denies daytime fatigue. Seatbelt? Yes Advanced directive?  Yes  Health Maintenance Shingrix- No DEXA- Yes Tetanus- Yes Pneumonia- Due Hep C screen- Yes  Past Medical History:  Diagnosis Date   Anxiety associated with depression    History of melanoma excision    Vaginal Pap smear, abnormal      Past Surgical History:  Procedure Laterality Date   BASAL CELL CARCINOMA EXCISION     x 3    MELANOMA EXCISION  2004    Medications  Current Outpatient Medications on File Prior to Visit  Medication Sig Dispense Refill   Ascorbic Acid (VITAMIN C) 1000 MG tablet Take 1,000 mg by mouth daily.     buPROPion  (WELLBUTRIN  XL) 300 MG 24 hr tablet Take 1 tablet (300 mg total) by mouth daily. 90 tablet 3   CALCIUM PO Take by mouth daily.     Cholecalciferol (VITAMIN D  PO) Take by mouth.     NON FORMULARY Take 2 capsules by mouth daily. Serevital     RESVERATROL PO Take by mouth daily.      Allergies Allergies  Allergen Reactions   Latex Hives    Latex gloves with Aloe Vera   Elemental Sulfur    Sulfa Antibiotics     Headache    Review of Systems: Constitutional:  no fevers Eye:  no recent significant change in vision Ears:  No changes in hearing Nose/Mouth/Throat:  no complaints of nasal congestion, no sore throat Cardiovascular: no chest pain Respiratory:  No shortness of breath Gastrointestinal:  No change in bowel habits GU:  Female: negative for dysuria Integumentary:  no abnormal skin lesions reported Neurologic:  no headaches Endocrine:  denies unexplained weight changes  Exam BP 128/78 (BP Location: Left Arm, Patient Position: Sitting)   Pulse 83   Temp 97.9 F (36.6 C) (Oral)   Resp 16   Ht 5' 3 (1.6 m)    Wt 132 lb (59.9 kg)   SpO2 95%   BMI 23.38 kg/m  General:  well developed, well nourished, in no apparent distress Skin:  no significant moles, warts, or growths Head:  no masses, lesions, or tenderness Eyes:  pupils equal and round, sclera anicteric without injection Ears:  canals without lesions, TMs shiny without retraction, no obvious effusion, no erythema Nose:  nares patent, mucosa normal, and no drainage Throat/Pharynx:  lips and gingiva without lesion; tongue and uvula midline; non-inflamed pharynx; no exudates or postnasal drainage Neck: neck supple without adenopathy, thyromegaly, or masses Lungs:  clear to auscultation, breath sounds equal bilaterally, no respiratory distress Cardio:  regular rate and rhythm, no bruits or LE edema Abdomen:  abdomen soft, nontender; bowel sounds normal; no masses or organomegaly Genital: Deferred Neuro:  gait normal; deep tendon reflexes normal and symmetric Psych: well oriented with normal range of affect and appropriate judgment/insight  Assessment and Plan  Well adult exam  Mixed hyperlipidemia - Plan: Comprehensive metabolic panel with GFR, Lipid panel   Well 78 y.o. female. Counseled on diet and exercise. PCV20 today.  Shingrix recommended. Other orders as above. Follow up in 6 mo. The patient voiced understanding and agreement to the plan.  Ariana [place], DO 02/06/24 9:16 AM

## 2024-02-06 NOTE — Addendum Note (Signed)
 Addended by: Cherina Dhillon M on: 02/06/2024 09:46 AM   Modules accepted: Orders

## 2024-02-06 NOTE — Patient Instructions (Signed)
Give us 2-3 business days to get the results of your labs back.  ? ?Keep the diet clean and stay active. ? ?The Shingrix vaccine (for shingles) is a 2 shot series spaced 2-6 months apart. It can make people feel low energy, achy and almost like they have the flu for 48 hours after injection. 1/5 people can have nausea and/or vomiting. Please plan accordingly when deciding on when to get this shot. Call your pharmacy to get this. The second shot of the series is less severe regarding the side effects, but it still lasts 48 hours.  ? ?Let us know if you need anything. ?

## 2024-04-15 ENCOUNTER — Other Ambulatory Visit: Payer: Self-pay | Admitting: Family Medicine
